# Patient Record
Sex: Female | Born: 1940
Health system: Southern US, Community
[De-identification: ages and names within clinical notes are randomized; demographics above are authoritative.]

## PROBLEM LIST (undated history)

## (undated) DIAGNOSIS — I10 Essential (primary) hypertension: Secondary | ICD-10-CM

## (undated) DIAGNOSIS — M109 Gout, unspecified: Secondary | ICD-10-CM

## (undated) DIAGNOSIS — M810 Age-related osteoporosis without current pathological fracture: Secondary | ICD-10-CM

## (undated) DIAGNOSIS — E785 Hyperlipidemia, unspecified: Secondary | ICD-10-CM

## (undated) HISTORY — DX: Gout, unspecified: M10.9

## (undated) HISTORY — PX: TONSILLECTOMY: SUR1361

## (undated) HISTORY — DX: Hyperlipidemia, unspecified: E78.5

## (undated) HISTORY — DX: Essential (primary) hypertension: I10

## (undated) HISTORY — DX: Age-related osteoporosis without current pathological fracture: M81.0

---

## 1998-03-13 ENCOUNTER — Other Ambulatory Visit: Admission: RE | Admit: 1998-03-13 | Discharge: 1998-03-13 | Payer: Self-pay | Admitting: Family Medicine

## 2001-08-30 ENCOUNTER — Other Ambulatory Visit: Admission: RE | Admit: 2001-08-30 | Discharge: 2001-08-30 | Payer: Self-pay | Admitting: Family Medicine

## 2002-11-28 ENCOUNTER — Other Ambulatory Visit: Admission: RE | Admit: 2002-11-28 | Discharge: 2002-11-28 | Payer: Self-pay | Admitting: Family Medicine

## 2005-05-19 ENCOUNTER — Other Ambulatory Visit: Admission: RE | Admit: 2005-05-19 | Discharge: 2005-05-19 | Payer: Self-pay | Admitting: Family Medicine

## 2010-04-29 ENCOUNTER — Encounter (INDEPENDENT_AMBULATORY_CARE_PROVIDER_SITE_OTHER): Payer: Self-pay | Admitting: *Deleted

## 2010-05-16 ENCOUNTER — Encounter (INDEPENDENT_AMBULATORY_CARE_PROVIDER_SITE_OTHER): Payer: Self-pay | Admitting: *Deleted

## 2010-05-19 ENCOUNTER — Ambulatory Visit
Admission: RE | Admit: 2010-05-19 | Discharge: 2010-05-19 | Payer: Self-pay | Source: Home / Self Care | Attending: Gastroenterology | Admitting: Gastroenterology

## 2010-05-22 NOTE — Letter (Signed)
Summary: Pre Visit Letter Revised  [facility]  [address] [place], [place] [address]   Phone: [phone]  Fax: [phone]        04/29/2010 MRN: [id number] Tamara Gross [address] [place], [place]  [address]             Procedure Date:  06-02-10   Welcome to the Gastroenterology Division at [facility].    You are scheduled to see a nurse for your pre-procedure visit on 05-19-10 at 2:00p.m. on the 3rd floor at [facility], [address]  We ask that you try to arrive at our office 15 minutes prior to your appointment time to allow for check-in.  Please take a minute to review the attached form.  If you answer "Yes" to one or more of the questions on the first page, we ask that you call the person listed at your earliest opportunity.  If you answer "No" to all of the questions, please complete the rest of the form and bring it to your appointment.    Your nurse visit will consist of discussing your medical and surgical history, your immediate family medical history, and your medications.   If you are unable to list all of your medications on the form, please bring the medication bottles to your appointment and we will list them.  We will need to be aware of both prescribed and over the counter drugs.  We will need to know exact dosage information as well.    Please be prepared to read and sign documents such as consent forms, a financial agreement, and acknowledgement forms.  If necessary, and with your consent, a friend or relative is welcome to sit-in on the nurse visit with you.  Please bring your insurance card so that we may make a copy of it.  If your insurance requires a referral to see a specialist, please bring your referral form from your primary care physician.  No co-pay is required for this nurse visit.     If you cannot keep your appointment, please call [phone] to cancel or reschedule prior to your appointment date.  This allows [place] the  opportunity to schedule an appointment for another patient in need of care.    Thank you for choosing [facility] for your medical needs.  We appreciate the opportunity to care for you.  Please visit [place] at our website  to learn more about our practice.  Sincerely, The Gastroenterology Division

## 2010-05-28 NOTE — Miscellaneous (Signed)
Summary: [facility]  Clinical Lists Changes  Medications: Added new medication of MOVIPREP 100 GM  SOLR (PEG-KCL-NACL-NASULF-NA ASC-C) As per prep instructions. - Signed Rx of MOVIPREP 100 GM  SOLR (PEG-KCL-NACL-NASULF-NA ASC-C) As per prep instructions.;  #1 x 0;  Signed;  Entered by: Ezra Sites RN;  Authorized by: Meryl Dare MD Clementeen Graham;  Method used: [facility] [id number]*, [address] [place], [place], [place]  [address], Ph: [phone] or [phone], Fax: [phone] Observations: Added new observation of NKA: T (05/19/2010 13:44)    Prescriptions: MOVIPREP 100 GM  SOLR (PEG-KCL-NACL-NASULF-NA ASC-C) As per prep instructions.  #1 x 0   Entered by:   Ezra Sites RN   Authorized by:   Meryl Dare MD [facility]   Signed by:   Ezra Sites RN on 05/19/2010   Method used:   Electronically to        [facility] [phone]* (retail)       [address]       [place], [place]  [address]       Ph: [phone] or [phone]       Fax: [phone]   RxID:   [id number]

## 2010-05-28 NOTE — Letter (Signed)
Summary: [facility] Instructions  [facility]  [address] [place], [place] [address]   Phone: [phone]  Fax: [phone]       RONIT CRANFIELD    1941/02/22    MRN: [id number]        Procedure Day /Date: Monday 06-02-10      Arrival Time: 10:30 am     Procedure Time: 11:30 am     Location of Procedure:                    _x _  [facility] (4th Floor)   PREPARATION FOR COLONOSCOPY WITH MOVIPREP   Starting 5 days prior to your procedure  05-28-10 do not eat nuts, seeds, popcorn, corn, beans, peas,  salads, or any raw vegetables.  Do not take any fiber supplements (e.g. Metamucil, Citrucel, and Benefiber).  THE DAY BEFORE YOUR PROCEDURE         DATE:  06-01-10   DAY: Sunday   1.  Drink clear liquids the entire day-NO SOLID FOOD  2.  Do not drink anything colored red or purple.  Avoid juices with pulp.  No orange juice.  3.  Drink at least 64 oz. (8 glasses) of fluid/clear liquids during the day to prevent dehydration and help the prep work efficiently.  CLEAR LIQUIDS INCLUDE: Water Jello Ice Popsicles Tea (sugar ok, no milk/cream) Powdered fruit flavored drinks Coffee (sugar ok, no milk/cream) Gatorade Juice: apple, white grape, white cranberry  Lemonade Clear bullion, consomm, broth Carbonated beverages (any kind) Strained chicken noodle soup Hard Candy                             4.  In the morning, mix first dose of MoviPrep solution:    Empty 1 Pouch A and 1 Pouch B into the disposable container    Add lukewarm drinking water to the top line of the container. Mix to dissolve    Refrigerate (mixed solution should be used within 24 hrs)  5.  Begin drinking the prep at 5:00 p.m. The MoviPrep container is divided by 4 marks.   Every 15 minutes drink the solution down to the next mark (approximately 8 oz) until the full liter is complete.   6.  Follow completed prep with 16 oz of clear liquid of your choice (Nothing red or purple).   Continue to drink clear liquids until bedtime.  7.  Before going to bed, mix second dose of MoviPrep solution:    Empty 1 Pouch A and 1 Pouch B into the disposable container    Add lukewarm drinking water to the top line of the container. Mix to dissolve    Refrigerate  THE DAY OF YOUR PROCEDURE      DATE:  06-02-10  DAY: Monday  Beginning at  6:30 a.m. (5 hours before procedure):         1. Every 15 minutes, drink the solution down to the next mark (approx 8 oz) until the full liter is complete.  2. Follow completed prep with 16 oz. of clear liquid of your choice.    3. You may drink clear liquids until  9:30 a.m. (2 HOURS BEFORE PROCEDURE).   MEDICATION INSTRUCTIONS  Unless otherwise instructed, you should take regular prescription medications with a small sip of water   as early as possible the morning of your procedure.  Additional medication instructions: Do not take Lisinopril/HCTZ day of  procedure.         OTHER INSTRUCTIONS  You will need a responsible adult at least 71 years of age to accompany you and drive you home.   This person must remain in the waiting room during your procedure.  Wear loose fitting clothing that is easily removed.  Leave jewelry and other valuables at home.  However, you may wish to bring a book to read or  an iPod/MP3 player to listen to music as you wait for your procedure to start.  Remove all body piercing jewelry and leave at home.  Total time from sign-in until discharge is approximately 2-3 hours.  You should go home directly after your procedure and rest.  You can resume normal activities the  day after your procedure.  The day of your procedure you should not:   Drive   Make legal decisions   Operate machinery   Drink alcohol   Return to work  You will receive specific instructions about eating, activities and medications before you leave.    The above instructions have been reviewed and explained to me by    Ezra Sites RN  May 19, 2010 2:18 PM     I fully understand and can verbalize these instructions _____________________________ Date _________

## 2010-06-02 ENCOUNTER — Encounter: Payer: Self-pay | Admitting: Gastroenterology

## 2010-06-02 ENCOUNTER — Other Ambulatory Visit (AMBULATORY_SURGERY_CENTER): Payer: Medicare Other | Admitting: Gastroenterology

## 2010-06-02 DIAGNOSIS — Z1211 Encounter for screening for malignant neoplasm of colon: Secondary | ICD-10-CM

## 2010-06-02 DIAGNOSIS — K5730 Diverticulosis of large intestine without perforation or abscess without bleeding: Secondary | ICD-10-CM

## 2010-06-11 NOTE — Procedures (Signed)
Summary: Colonoscopy  Patient: Tamara Gross Note: All result statuses are Final unless otherwise noted.  Tests: (1) Colonoscopy (COL)   COL Colonoscopy           DONE     [facility]     [address]     [place], [place]  [address]           COLONOSCOPY PROCEDURE REPORT     PATIENT:  [name], [name]  MR#:  [id number]     BIRTHDATE:  Alyviah 27, 1942, 69 yrs. old  GENDER:  female     ENDOSCOPIST:  Judie Petit T. Russella Dar, MD, [facility]     Referred by:  Rudi Heap, M.D.     PROCEDURE DATE:  06/02/2010     PROCEDURE:  Average-risk screening colonoscopy G0121     ASA CLASS:  Class II     INDICATIONS:  1) Routine Risk Screening     MEDICATIONS:   Fentanyl 50 mcg IV, Versed 5 mg IV     DESCRIPTION OF PROCEDURE:   After the risks benefits and     alternatives of the procedure were thoroughly explained, informed     consent was obtained.  Digital rectal exam was performed and     revealed no abnormalities.   The LB [id number]-[id number] endoscope     was introduced through the anus and advanced to the cecum, which     was identified by both the appendix and ileocecal valve, without     limitations.  The quality of the prep was good, using MoviPrep.     The instrument was then slowly withdrawn as the colon was fully     examined.     <<PROCEDUREIMAGES>>     FINDINGS:  Moderate diverticulosis was found in the sigmoid colon.     A normal appearing cecum, ileocecal valve, and appendiceal orifice     were identified. The ascending, hepatic flexure, transverse,     splenic flexure, descending colon, and rectum appeared     unremarkable. Retroflexed views in the rectum revealed no     abnormalities. The time to cecum =  3  minutes. The scope was then     withdrawn (time =  9  min) from the patient and the procedure     completed.           COMPLICATIONS:  None           ENDOSCOPIC IMPRESSION:     1) Moderate diverticulosis in the sigmoid colon           RECOMMENDATIONS:     1) High fiber diet with  liberal fluid intake.     2) Continue current colorectal screening for "routine risk"     patients with a repeat colonoscopy in 10 years.           Venita Lick. Russella Dar, MD, Clementeen Graham           n.     eSIGNED:   Venita Lick. Zuhair Lariccia at 06/02/2010 11:28 AM           Arvil Persons [id number]  Note: An exclamation mark (!) indicates a result that was not dispersed into the flowsheet. Document Creation Date: 06/02/2010 11:29 AM _______________________________________________________________________  (1) Order result status: Final Collection or observation date-time: 06/02/2010 11:25 Requested date-time:  Receipt date-time:  Reported date-time:  Referring Physician:   Ordering Physician: Claudette Head [phone]) Specimen Source:  Source: Launa Grill Order Number: [id number] Lab site:   Appended Document: Colonoscopy  Clinical Lists Changes  Observations: Added new observation of COLONNXTDUE: 05/2020 (06/02/2010 12:05)

## 2011-06-22 DIAGNOSIS — M109 Gout, unspecified: Secondary | ICD-10-CM | POA: Diagnosis not present

## 2011-06-22 DIAGNOSIS — N289 Disorder of kidney and ureter, unspecified: Secondary | ICD-10-CM | POA: Diagnosis not present

## 2011-06-22 DIAGNOSIS — N189 Chronic kidney disease, unspecified: Secondary | ICD-10-CM | POA: Diagnosis not present

## 2011-06-22 DIAGNOSIS — E559 Vitamin D deficiency, unspecified: Secondary | ICD-10-CM | POA: Diagnosis not present

## 2011-06-22 DIAGNOSIS — I10 Essential (primary) hypertension: Secondary | ICD-10-CM | POA: Diagnosis not present

## 2011-06-22 DIAGNOSIS — Z23 Encounter for immunization: Secondary | ICD-10-CM | POA: Diagnosis not present

## 2011-06-22 DIAGNOSIS — E785 Hyperlipidemia, unspecified: Secondary | ICD-10-CM | POA: Diagnosis not present

## 2011-09-28 DIAGNOSIS — L80 Vitiligo: Secondary | ICD-10-CM | POA: Diagnosis not present

## 2011-09-28 DIAGNOSIS — L299 Pruritus, unspecified: Secondary | ICD-10-CM | POA: Diagnosis not present

## 2011-09-28 DIAGNOSIS — L259 Unspecified contact dermatitis, unspecified cause: Secondary | ICD-10-CM | POA: Diagnosis not present

## 2011-10-08 DIAGNOSIS — L80 Vitiligo: Secondary | ICD-10-CM | POA: Diagnosis not present

## 2011-11-16 DIAGNOSIS — L80 Vitiligo: Secondary | ICD-10-CM | POA: Diagnosis not present

## 2012-01-27 DIAGNOSIS — Z23 Encounter for immunization: Secondary | ICD-10-CM | POA: Diagnosis not present

## 2012-02-15 DIAGNOSIS — L80 Vitiligo: Secondary | ICD-10-CM | POA: Diagnosis not present

## 2012-06-08 DIAGNOSIS — L80 Vitiligo: Secondary | ICD-10-CM | POA: Diagnosis not present

## 2012-06-10 DIAGNOSIS — L80 Vitiligo: Secondary | ICD-10-CM | POA: Diagnosis not present

## 2012-06-14 DIAGNOSIS — M109 Gout, unspecified: Secondary | ICD-10-CM | POA: Diagnosis not present

## 2012-06-14 DIAGNOSIS — E559 Vitamin D deficiency, unspecified: Secondary | ICD-10-CM | POA: Diagnosis not present

## 2012-06-14 DIAGNOSIS — E785 Hyperlipidemia, unspecified: Secondary | ICD-10-CM | POA: Diagnosis not present

## 2012-06-14 DIAGNOSIS — I10 Essential (primary) hypertension: Secondary | ICD-10-CM | POA: Diagnosis not present

## 2012-06-20 DIAGNOSIS — L80 Vitiligo: Secondary | ICD-10-CM | POA: Diagnosis not present

## 2012-07-11 DIAGNOSIS — L80 Vitiligo: Secondary | ICD-10-CM | POA: Diagnosis not present

## 2012-07-13 DIAGNOSIS — L80 Vitiligo: Secondary | ICD-10-CM | POA: Diagnosis not present

## 2012-07-18 DIAGNOSIS — L80 Vitiligo: Secondary | ICD-10-CM | POA: Diagnosis not present

## 2012-07-20 DIAGNOSIS — L80 Vitiligo: Secondary | ICD-10-CM | POA: Diagnosis not present

## 2012-07-25 DIAGNOSIS — L80 Vitiligo: Secondary | ICD-10-CM | POA: Diagnosis not present

## 2012-07-27 DIAGNOSIS — L80 Vitiligo: Secondary | ICD-10-CM | POA: Diagnosis not present

## 2012-08-03 DIAGNOSIS — L80 Vitiligo: Secondary | ICD-10-CM | POA: Diagnosis not present

## 2012-08-17 DIAGNOSIS — L80 Vitiligo: Secondary | ICD-10-CM | POA: Diagnosis not present

## 2012-08-22 DIAGNOSIS — L80 Vitiligo: Secondary | ICD-10-CM | POA: Diagnosis not present

## 2012-08-24 DIAGNOSIS — L80 Vitiligo: Secondary | ICD-10-CM | POA: Diagnosis not present

## 2012-08-29 DIAGNOSIS — L80 Vitiligo: Secondary | ICD-10-CM | POA: Diagnosis not present

## 2012-08-31 DIAGNOSIS — L80 Vitiligo: Secondary | ICD-10-CM | POA: Diagnosis not present

## 2012-09-07 DIAGNOSIS — L80 Vitiligo: Secondary | ICD-10-CM | POA: Diagnosis not present

## 2012-09-16 ENCOUNTER — Encounter: Payer: Self-pay | Admitting: Family Medicine

## 2012-09-16 ENCOUNTER — Ambulatory Visit (INDEPENDENT_AMBULATORY_CARE_PROVIDER_SITE_OTHER): Payer: Medicare Other | Admitting: Family Medicine

## 2012-09-16 VITALS — BP 122/80 | HR 80 | Temp 97.3°F | Ht 61.0 in | Wt 120.0 lb

## 2012-09-16 DIAGNOSIS — M810 Age-related osteoporosis without current pathological fracture: Secondary | ICD-10-CM

## 2012-09-16 DIAGNOSIS — M109 Gout, unspecified: Secondary | ICD-10-CM | POA: Diagnosis not present

## 2012-09-16 DIAGNOSIS — E559 Vitamin D deficiency, unspecified: Secondary | ICD-10-CM

## 2012-09-16 DIAGNOSIS — G510 Bell's palsy: Secondary | ICD-10-CM | POA: Insufficient documentation

## 2012-09-16 DIAGNOSIS — L80 Vitiligo: Secondary | ICD-10-CM | POA: Insufficient documentation

## 2012-09-16 DIAGNOSIS — N189 Chronic kidney disease, unspecified: Secondary | ICD-10-CM | POA: Diagnosis not present

## 2012-09-16 DIAGNOSIS — I10 Essential (primary) hypertension: Secondary | ICD-10-CM | POA: Insufficient documentation

## 2012-09-16 DIAGNOSIS — E785 Hyperlipidemia, unspecified: Secondary | ICD-10-CM | POA: Insufficient documentation

## 2012-09-16 DIAGNOSIS — Z780 Asymptomatic menopausal state: Secondary | ICD-10-CM | POA: Insufficient documentation

## 2012-09-16 LAB — COMPLETE METABOLIC PANEL WITH GFR
ALT: <8 U/L (ref 0–35)
AST: 12 U/L (ref 0–37)
Albumin: 4.3 g/dL (ref 3.5–5.2)
Alkaline Phosphatase: 54 U/L (ref 39–117)
BUN: 22 mg/dL (ref 6–23)
CO2: 23 mEq/L (ref 19–32)
Calcium: 9.2 mg/dL (ref 8.4–10.5)
Chloride: 109 mEq/L (ref 96–112)
Creat: 1.57 mg/dL — ABNORMAL HIGH (ref 0.50–1.10)
GFR, Est African American: 38 mL/min — ABNORMAL LOW
GFR, Est Non African American: 33 mL/min — ABNORMAL LOW
Glucose, Bld: 92 mg/dL (ref 70–99)
Potassium: 4.7 mEq/L (ref 3.5–5.3)
Sodium: 140 mEq/L (ref 135–145)
Total Bilirubin: 0.4 mg/dL (ref 0.3–1.2)
Total Protein: 7 g/dL (ref 6.0–8.3)

## 2012-09-16 LAB — URIC ACID: Uric Acid, Serum: 5.2 mg/dL (ref 2.4–6.0)

## 2012-09-16 MED ORDER — ALLOPURINOL 100 MG PO TABS: 100.0000 mg | ORAL_TABLET | Freq: Every day | ORAL | Status: DC

## 2012-09-16 MED ORDER — RALOXIFENE HCL 60 MG PO TABS: 60.0000 mg | ORAL_TABLET | Freq: Every day | ORAL | Status: DC

## 2012-09-16 MED ORDER — LISINOPRIL 20 MG PO TABS: 20.0000 mg | ORAL_TABLET | Freq: Every day | ORAL | Status: DC

## 2012-09-16 MED ORDER — NIFEDIPINE ER OSMOTIC RELEASE 60 MG PO TB24: 60.0000 mg | ORAL_TABLET | Freq: Every day | ORAL | Status: DC

## 2012-09-16 NOTE — Patient Instructions (Signed)
      Dr Olina Melfi's Recommendations  Diet and Exercise discussed with patient.  For nutrition information, I recommend books:  1).Eat to Live by Dr Joel Fuhrman. 2).Prevent and Reverse Heart Disease by Dr Caldwell Esselstyn. 3) Dr Neal Barnard's Book: Reversing Diabetes  Exercise recommendations are:  If unable to walk, then the patient can exercise in a chair 3 times a day. By flapping arms like a bird gently and raising legs outwards to the front.  If ambulatory, the patient can go for walks for 30 minutes 3 times a week. Then increase the intensity and duration as tolerated.  Goal is to try to attain exercise frequency to 5 times a week.  If applicable: Best to perform resistance exercises (machines or weights) 2 days a week and cardio type exercises 3 days per week.  

## 2012-09-16 NOTE — Progress Notes (Signed)
Patient ID: Tamara Gross, female   DOB: August 03, 1940, 72 y.o.   MRN: [id number] SUBJECTIVE: HPI: Patient is here for follow up of hypertension:  denies Headache;deniesChest Pain;denies weakness;denies Shortness of Breath or Orthopnea;denies Visual changes;denies palpitations;denies cough;denies pedal edema;denies symptoms of TIA or stroke; admits to Compliance with medications. denies Problems with medications. Doing great . No problems.  Having Photo therapy with Dr [place] for her Vitiligo  PMH/PSH: reviewed/updated in Epic  SH/FH: reviewed/updated in Epic  Allergies: reviewed/updated in Epic  Medications: reviewed/updated in Epic  Immunizations: reviewed/updated in Epic  ROS: As above in the HPI. All other systems are stable or negative.  OBJECTIVE: APPEARANCE:  Patient in no acute distress.The patient appeared well nourished and normally developed. Acyanotic. Waist: VITAL SIGNS:BP 122/80  Pulse 80  Temp(Src) 97.3 F (36.3 C) (Oral)  Ht 5\' 1"  (1.549 m)  Wt 120 lb (54.432 kg)  BMI 22.69 kg/m2 Elderly WF Looks well  SKIN: warm and  Dry without overt rashes, tattoos and scars. Marked Vitiligo especially of the neck and upper chest  HEAD and Neck: without JVD, Head and scalp: normal Eyes:No scleral icterus. Fundi normal, eye movements normal. Ears: Auricle normal, canal normal, Tympanic membranes normal, insufflation normal. Nose: normal Throat: normal Neck & thyroid: normal  CHEST & LUNGS: Chest wall: normal Lungs: Clear  CVS: Reveals the PMI to be normally located. Regular rhythm, First and Second Heart sounds are normal,  absence of murmurs, rubs or gallops. Peripheral vasculature: Radial pulses: normal Dorsal pedis pulses: normal Posterior pulses: normal  ABDOMEN:  Appearance: normal Benign,, no organomegaly, no masses, no Abdominal Aortic enlargement. No Guarding , no rebound. No Bruits. Bowel sounds: normal  RECTAL: N/A GU: N/A  EXTREMETIES:  nonedematous. Both Femoral and Pedal pulses are normal.  MUSCULOSKELETAL:  Spine: mild kyphosis Joints: intact  NEUROLOGIC: oriented to time,place and person; nonfocal. Strength is normal Sensory is normal Reflexes are normal Cranial Nervesfacila nerve palsy no changes. ASSESSMENT: HTN (hypertension) - Plan: NIFEdipine (PROCARDIA XL/ADALAT-CC) 60 MG 24 hr tablet, lisinopril (PRINIVIL,ZESTRIL) 20 MG tablet, COMPLETE METABOLIC PANEL WITH GFR  CKD (chronic kidney disease), unspecified stage - Plan: COMPLETE METABOLIC PANEL WITH GFR  Gout - Plan: allopurinol (ZYLOPRIM) 100 MG tablet, COMPLETE METABOLIC PANEL WITH GFR, Uric acid  HLD (hyperlipidemia) - Plan: NMR Lipoprofile with Lipids, COMPLETE METABOLIC PANEL WITH GFR  Vitiligo  Osteoporosis, unspecified  Facial nerve palsy  Postmenopausal - Plan: raloxifene (EVISTA) 60 MG tablet  Unspecified vitamin D deficiency - Plan: Vitamin D 25 hydroxy  PLAN: Orders Placed This Encounter  Procedures  . Vitamin D 25 hydroxy  . NMR Lipoprofile with Lipids  . COMPLETE METABOLIC PANEL WITH GFR  . Uric acid   No results found for this or any previous visit. Meds ordered this encounter  Medications  . DISCONTD: allopurinol (ZYLOPRIM) 100 MG tablet    Sig: Take 100 mg by mouth daily.   Marland Kitchen DISCONTD: lisinopril (PRINIVIL,ZESTRIL) 20 MG tablet    Sig: Take 20 mg by mouth daily.   Marland Kitchen DISCONTD: NIFEdipine (PROCARDIA XL/ADALAT-CC) 60 MG 24 hr tablet    Sig: Take 60 mg by mouth daily.   Marland Kitchen DISCONTD: EVISTA 60 MG tablet    Sig: Take 60 mg by mouth daily.   Marland Kitchen NIFEdipine (PROCARDIA XL/ADALAT-CC) 60 MG 24 hr tablet    Sig: Take 1 tablet (60 mg total) by mouth daily.    Dispense:  90 tablet    Refill:  3  . lisinopril (PRINIVIL,ZESTRIL) 20 MG tablet  Sig: Take 1 tablet (20 mg total) by mouth daily.    Dispense:  90 tablet    Refill:  3  . raloxifene (EVISTA) 60 MG tablet    Sig: Take 1 tablet (60 mg total) by mouth daily.    Dispense:  90  tablet    Refill:  3  . allopurinol (ZYLOPRIM) 100 MG tablet    Sig: Take 1 tablet (100 mg total) by mouth daily.    Dispense:  90 tablet    Refill:  3        Dr Woodroe Mode Recommendations  Diet and Exercise discussed with patient.  For nutrition information, I recommend books:  1).Eat to Live by Dr Monico Hoar. 2).Prevent and Reverse Heart Disease by Dr Suzzette Righter. 3) Dr Katherina Right Book: Reversing Diabetes  Exercise recommendations are:  If unable to walk, then the patient can exercise in a chair 3 times a day. By flapping arms like a bird gently and raising legs outwards to the front.  If ambulatory, the patient can go for walks for 30 minutes 3 times a week. Then increase the intensity and duration as tolerated.  Goal is to try to attain exercise frequency to 5 times a week.  If applicable: Best to perform resistance exercises (machines or weights) 2 days a week and cardio type exercises 3 days per week. Continue a healthy lifestyle.  Return in about 4 months (around 01/17/2013) for Recheck medical problems.  Erasmo Vertz P. Modesto Charon, M.D.

## 2012-09-17 LAB — VITAMIN D 25 HYDROXY (VIT D DEFICIENCY, FRACTURES): Vit D, 25-Hydroxy: 36 ng/mL (ref 30–89)

## 2012-09-19 DIAGNOSIS — L80 Vitiligo: Secondary | ICD-10-CM | POA: Diagnosis not present

## 2012-09-20 LAB — NMR LIPOPROFILE WITH LIPIDS
Cholesterol, Total: 155 mg/dL (ref <200)
HDL Particle Number: 27.7 umol/L — ABNORMAL LOW (ref >=30.5)
HDL Size: 9 nm — ABNORMAL LOW (ref >=9.2)
HDL-C: 43 mg/dL (ref >=40)
LDL (calc): 90 mg/dL (ref <100)
LDL Particle Number: 1149 nmol/L — ABNORMAL HIGH (ref <1000)
LDL Size: 20.8 nm (ref >20.5)
LP-IR Score: 37 (ref <=45)
Large HDL-P: 5.5 umol/L (ref >=4.8)
Large VLDL-P: <0.8 nmol/L (ref <=2.7)
Small LDL Particle Number: 455 nmol/L (ref <=527)
Triglycerides: 111 mg/dL (ref <150)
VLDL Size: 46.4 nm (ref <=46.6)

## 2012-09-20 NOTE — Progress Notes (Signed)
Quick Note:  Lab result at goal or close to goal. Aggressive dietary changes. exercise. No change in Medications for now. No Change in plans and follow up. ______

## 2012-09-21 ENCOUNTER — Telehealth: Payer: Self-pay | Admitting: Family Medicine

## 2012-09-21 NOTE — Telephone Encounter (Signed)
lmtcb- 6/4-jhb

## 2012-09-26 DIAGNOSIS — L80 Vitiligo: Secondary | ICD-10-CM | POA: Diagnosis not present

## 2012-09-28 DIAGNOSIS — L80 Vitiligo: Secondary | ICD-10-CM | POA: Diagnosis not present

## 2012-10-06 ENCOUNTER — Encounter: Payer: Self-pay | Admitting: *Deleted

## 2012-10-12 DIAGNOSIS — L80 Vitiligo: Secondary | ICD-10-CM | POA: Diagnosis not present

## 2012-11-02 DIAGNOSIS — L80 Vitiligo: Secondary | ICD-10-CM | POA: Diagnosis not present

## 2012-11-14 DIAGNOSIS — L80 Vitiligo: Secondary | ICD-10-CM | POA: Diagnosis not present

## 2012-11-17 DIAGNOSIS — L80 Vitiligo: Secondary | ICD-10-CM | POA: Diagnosis not present

## 2013-01-31 ENCOUNTER — Ambulatory Visit (INDEPENDENT_AMBULATORY_CARE_PROVIDER_SITE_OTHER): Payer: Medicare Other | Admitting: Family Medicine

## 2013-01-31 ENCOUNTER — Encounter: Payer: Self-pay | Admitting: Family Medicine

## 2013-01-31 VITALS — BP 114/70 | HR 87 | Temp 98.3°F | Ht 60.0 in | Wt 114.8 lb

## 2013-01-31 DIAGNOSIS — N189 Chronic kidney disease, unspecified: Secondary | ICD-10-CM | POA: Diagnosis not present

## 2013-01-31 DIAGNOSIS — L80 Vitiligo: Secondary | ICD-10-CM

## 2013-01-31 DIAGNOSIS — M109 Gout, unspecified: Secondary | ICD-10-CM | POA: Diagnosis not present

## 2013-01-31 DIAGNOSIS — E785 Hyperlipidemia, unspecified: Secondary | ICD-10-CM

## 2013-01-31 DIAGNOSIS — Z23 Encounter for immunization: Secondary | ICD-10-CM

## 2013-01-31 DIAGNOSIS — M810 Age-related osteoporosis without current pathological fracture: Secondary | ICD-10-CM | POA: Diagnosis not present

## 2013-01-31 DIAGNOSIS — G510 Bell's palsy: Secondary | ICD-10-CM

## 2013-01-31 DIAGNOSIS — I10 Essential (primary) hypertension: Secondary | ICD-10-CM | POA: Diagnosis not present

## 2013-01-31 DIAGNOSIS — Z780 Asymptomatic menopausal state: Secondary | ICD-10-CM

## 2013-01-31 NOTE — Progress Notes (Signed)
Patient ID: Tamara Gross, female   DOB: May 16, 1940, 72 y.o.   MRN: [id number] SUBJECTIVE: CC: Chief Complaint  Patient presents with  . Follow-up    4 month follow up   . Medication Refill    needs refills     HPI: Has been Okay. Nothing new, no problems. Patient is here for follow up of hyperlipidemia/HTN/osteoporosis: denies Headache;denies Chest Pain;denies weakness;denies Shortness of Breath and orthopnea;denies Visual changes;denies palpitations;denies cough;denies pedal edema;denies symptoms of TIA or stroke;deniesClaudication symptoms. admits to Compliance with medications; denies Problems with medications.  Past Medical History  Diagnosis Date  . Gout   . Hyperlipidemia   . Osteoporosis    Past Surgical History  Procedure Laterality Date  . Tonsillectomy     History   Social History  . Marital Status: Widowed    Spouse Name: N/A    Number of Children: N/A  . Years of Education: N/A   Occupational History  . Not on file.   Social History Main Topics  . Smoking status: Never Smoker   . Smokeless tobacco: Not on file  . Alcohol Use: Not on file  . Drug Use: Not on file  . Sexual Activity: Not on file   Other Topics Concern  . Not on file   Social History Narrative  . No narrative on file   Family History  Problem Relation Age of Onset  . Deep vein thrombosis Father    Current Outpatient Prescriptions on File Prior to Visit  Medication Sig Dispense Refill  . allopurinol (ZYLOPRIM) 100 MG tablet Take 1 tablet (100 mg total) by mouth daily.  90 tablet  3  . lisinopril (PRINIVIL,ZESTRIL) 20 MG tablet Take 1 tablet (20 mg total) by mouth daily.  90 tablet  3  . NIFEdipine (PROCARDIA XL/ADALAT-CC) 60 MG 24 hr tablet Take 1 tablet (60 mg total) by mouth daily.  90 tablet  3  . raloxifene (EVISTA) 60 MG tablet Take 1 tablet (60 mg total) by mouth daily.  90 tablet  3   No current facility-administered medications on file prior to visit.   No Known  Allergies Immunization History  Administered Date(s) Administered  . Influenza,inj,Quad PF,36+ Mos 01/31/2013  . Pneumococcal Polysaccharide 07/29/2011   Prior to Admission medications   Medication Sig Start Date End Date Taking? Authorizing Provider  allopurinol (ZYLOPRIM) 100 MG tablet Take 1 tablet (100 mg total) by mouth daily. 09/16/12  Yes Ileana Ladd, MD  Cholecalciferol (VITAMIN D) 2000 UNITS CAPS Take 1 capsule by mouth.   Yes Historical Provider, MD  lisinopril (PRINIVIL,ZESTRIL) 20 MG tablet Take 1 tablet (20 mg total) by mouth daily. 09/16/12  Yes Ileana Ladd, MD  Multiple Vitamin (MULTIVITAMIN) tablet Take 1 tablet by mouth daily.   Yes Historical Provider, MD  NIFEdipine (PROCARDIA XL/ADALAT-CC) 60 MG 24 hr tablet Take 1 tablet (60 mg total) by mouth daily. 09/16/12  Yes Ileana Ladd, MD  raloxifene (EVISTA) 60 MG tablet Take 1 tablet (60 mg total) by mouth daily. 09/16/12  Yes Ileana Ladd, MD     ROS: As above in the HPI. All other systems are stable or negative.  OBJECTIVE: APPEARANCE:  Patient in no acute distress.The patient appeared well nourished and normally developed. Acyanotic. Waist: VITAL SIGNS:BP 114/70  Pulse 87  Temp(Src) 98.3 F (36.8 C) (Oral)  Ht 5' (1.524 m)  Wt 114 lb 12.8 oz (52.073 kg)  BMI 22.42 kg/m2 WF  SKIN: warm and  Dry without overt  rashes, tattoos and scars.vitiligo+++  HEAD and Neck: without JVD, Head and scalp: left bell's palsy residual Eyes:No scleral icterus. Fundi normal, eye movements normal. Ears: Auricle normal, canal normal, Tympanic membranes normal, insufflation normal. Nose: normal Throat: normal Neck & thyroid: normal  CHEST & LUNGS: Chest wall: normal Lungs: Clear  CVS: Reveals the PMI to be normally located. Regular rhythm, First and Second Heart sounds are normal,  absence of murmurs, rubs or gallops. Peripheral vasculature: Radial pulses: normal Dorsal pedis pulses: normal Posterior pulses:  normal  ABDOMEN:  Appearance: normal Benign, no organomegaly, no masses, no Abdominal Aortic enlargement. No Guarding , no rebound. No Bruits. Bowel sounds: normal  RECTAL: N/A GU: N/A  EXTREMETIES: nonedematous.  MUSCULOSKELETAL:  Spine: mild kyphosis Joints: intact  NEUROLOGIC: oriented to time,place and person; nonfocal. Strength is normal Sensory is normal Reflexes are normal Cranial Nerves are normal.  Results for orders placed in visit on 09/16/12  VITAMIN D 25 HYDROXY      Result Value Range   Vit D, 25-Hydroxy 36  30 - 89 ng/mL  NMR LIPOPROFILE WITH LIPIDS      Result Value Range   LDL Particle Number 1149 (*) <1000 nmol/L   LDL (calc) 90  <100 mg/dL   HDL-C 43  >=16 mg/dL   Triglycerides 109  <604 mg/dL   Cholesterol, Total 540  <200 mg/dL   HDL Particle Number 98.1 (*) >=30.5 umol/L   Large HDL-P 5.5  >=4.8 umol/L   Large VLDL-P <0.8  <=2.7 nmol/L   Small LDL Particle Number 455  <=527 nmol/L   LDL Size 20.8  >20.5 nm   HDL Size 9.0 (*) >=9.2 nm   VLDL Size 46.4  <=46.6 nm   LP-IR Score 37  <=45  COMPLETE METABOLIC PANEL WITH GFR      Result Value Range   Sodium 140  135 - 145 mEq/L   Potassium 4.7  3.5 - 5.3 mEq/L   Chloride 109  96 - 112 mEq/L   CO2 23  19 - 32 mEq/L   Glucose, Bld 92  70 - 99 mg/dL   BUN 22  6 - 23 mg/dL   Creat 1.91 (*) 4.78 - 1.10 mg/dL   Total Bilirubin 0.4  0.3 - 1.2 mg/dL   Alkaline Phosphatase 54  39 - 117 U/L   AST 12  0 - 37 U/L   ALT <8  0 - 35 U/L   Total Protein 7.0  6.0 - 8.3 g/dL   Albumin 4.3  3.5 - 5.2 g/dL   Calcium 9.2  8.4 - 29.5 mg/dL   GFR, Est African American 38 (*)    GFR, Est Non African American 33 (*)   URIC ACID      Result Value Range   Uric Acid, Serum 5.2  2.4 - 6.0 mg/dL    ASSESSMENT: Need for prophylactic vaccination and inoculation against influenza  CKD (chronic kidney disease) - Plan: CMP14+EGFR  Gout - Plan: CMP14+EGFR  HLD (hyperlipidemia) - Plan: CMP14+EGFR, Lipid panel  HTN  (hypertension)  Vitiligo  Osteoporosis, unspecified - Plan: Vit D  25 hydroxy (rtn osteoporosis monitoring)  Postmenopausal  Facial nerve palsy  Patient is  stanble and doing well. Health maintenance is UTD except for zostavax and TDAP. She received her flushot today. She plans to check with her insurance the coverage.   PLAN: Orders Placed This Encounter  Procedures  . CMP14+EGFR  . Lipid panel  . Vit D  25 hydroxy (rtn  osteoporosis monitoring)   Protect the left eye with artificial tears and taping down lids at night. Same medications to be continued. They were reviewed. Keep active. Healthy diet.  Return in about 5 months (around 07/01/2013) for Recheck medical problems. Follow up in 5 months instead of 4 months due to peak flu activity months is in 3 to 5 months.  Glendon Fiser P. Modesto Charon, M.D.

## 2013-02-01 LAB — CMP14+EGFR
ALT: 8 IU/L (ref 0–32)
AST: 11 IU/L (ref 0–40)
Albumin/Globulin Ratio: 1.6 (ref 1.1–2.5)
Albumin: 4.2 g/dL (ref 3.5–4.8)
Alkaline Phosphatase: 61 IU/L (ref 39–117)
BUN/Creatinine Ratio: 12 (ref 11–26)
BUN: 21 mg/dL (ref 8–27)
CO2: 21 mmol/L (ref 18–29)
Calcium: 9.6 mg/dL (ref 8.6–10.2)
Chloride: 103 mmol/L (ref 97–108)
Creatinine, Ser: 1.69 mg/dL — ABNORMAL HIGH (ref 0.57–1.00)
GFR calc Af Amer: 34 mL/min/{1.73_m2} — ABNORMAL LOW (ref >59)
GFR calc non Af Amer: 30 mL/min/{1.73_m2} — ABNORMAL LOW (ref >59)
Globulin, Total: 2.6 g/dL (ref 1.5–4.5)
Glucose: 92 mg/dL (ref 65–99)
Potassium: 4.8 mmol/L (ref 3.5–5.2)
Sodium: 140 mmol/L (ref 134–144)
Total Bilirubin: 0.3 mg/dL (ref 0.0–1.2)
Total Protein: 6.8 g/dL (ref 6.0–8.5)

## 2013-02-01 LAB — LIPID PANEL
Chol/HDL Ratio: 3.2 ratio units (ref 0.0–4.4)
Cholesterol, Total: 163 mg/dL (ref 100–199)
HDL: 51 mg/dL (ref >39)
LDL Calculated: 89 mg/dL (ref 0–99)
Triglycerides: 116 mg/dL (ref 0–149)
VLDL Cholesterol Cal: 23 mg/dL (ref 5–40)

## 2013-02-01 LAB — VITAMIN D 25 HYDROXY (VIT D DEFICIENCY, FRACTURES): Vit D, 25-Hydroxy: 34.9 ng/mL (ref 30.0–100.0)

## 2013-05-22 DIAGNOSIS — L819 Disorder of pigmentation, unspecified: Secondary | ICD-10-CM | POA: Diagnosis not present

## 2013-05-22 DIAGNOSIS — L80 Vitiligo: Secondary | ICD-10-CM | POA: Diagnosis not present

## 2013-07-04 ENCOUNTER — Encounter: Payer: Self-pay | Admitting: Family Medicine

## 2013-07-04 ENCOUNTER — Ambulatory Visit (INDEPENDENT_AMBULATORY_CARE_PROVIDER_SITE_OTHER): Payer: Medicare Other | Admitting: Family Medicine

## 2013-07-04 VITALS — BP 119/74 | HR 86 | Temp 97.1°F | Ht 60.0 in | Wt 117.4 lb

## 2013-07-04 DIAGNOSIS — Z23 Encounter for immunization: Secondary | ICD-10-CM

## 2013-07-04 DIAGNOSIS — M109 Gout, unspecified: Secondary | ICD-10-CM

## 2013-07-04 DIAGNOSIS — Z780 Asymptomatic menopausal state: Secondary | ICD-10-CM

## 2013-07-04 DIAGNOSIS — E785 Hyperlipidemia, unspecified: Secondary | ICD-10-CM

## 2013-07-04 DIAGNOSIS — M810 Age-related osteoporosis without current pathological fracture: Secondary | ICD-10-CM

## 2013-07-04 DIAGNOSIS — N189 Chronic kidney disease, unspecified: Secondary | ICD-10-CM | POA: Diagnosis not present

## 2013-07-04 DIAGNOSIS — I10 Essential (primary) hypertension: Secondary | ICD-10-CM

## 2013-07-04 DIAGNOSIS — L80 Vitiligo: Secondary | ICD-10-CM

## 2013-07-04 DIAGNOSIS — G510 Bell's palsy: Secondary | ICD-10-CM

## 2013-07-04 NOTE — Progress Notes (Signed)
Patient ID: Tamara Gross, female   DOB: January 09, 1941, 73 y.o.   MRN: [id number] SUBJECTIVE: CC: Chief Complaint  Patient presents with  . Follow-up    5 month folllow up  chronic problems     HPI: Patient is here for follow up of hyperlipidemia/gout/osteoporosis: denies Headache;denies Chest Pain;denies weakness;denies Shortness of Breath and orthopnea;denies Visual changes;denies palpitations;denies cough;denies pedal edema;denies symptoms of TIA or stroke;deniesClaudication symptoms. admits to Compliance with medications; denies Problems with medications.   Past Medical History  Diagnosis Date  . Gout   . Hyperlipidemia   . Osteoporosis    Past Surgical History  Procedure Laterality Date  . Tonsillectomy     History   Social History  . Marital Status: Widowed    Spouse Name: N/A    Number of Children: N/A  . Years of Education: N/A   Occupational History  . Not on file.   Social History Main Topics  . Smoking status: Never Smoker   . Smokeless tobacco: Not on file  . Alcohol Use: Not on file  . Drug Use: Not on file  . Sexual Activity: Not on file   Other Topics Concern  . Not on file   Social History Narrative  . No narrative on file   Family History  Problem Relation Age of Onset  . Deep vein thrombosis Father    Current Outpatient Prescriptions on File Prior to Visit  Medication Sig Dispense Refill  . allopurinol (ZYLOPRIM) 100 MG tablet Take 1 tablet (100 mg total) by mouth daily.  90 tablet  3  . Cholecalciferol (VITAMIN D) 2000 UNITS CAPS Take 1 capsule by mouth.      Marland Kitchen lisinopril (PRINIVIL,ZESTRIL) 20 MG tablet Take 1 tablet (20 mg total) by mouth daily.  90 tablet  3  . Multiple Vitamin (MULTIVITAMIN) tablet Take 1 tablet by mouth daily.      Marland Kitchen NIFEdipine (PROCARDIA XL/ADALAT-CC) 60 MG 24 hr tablet Take 1 tablet (60 mg total) by mouth daily.  90 tablet  3  . raloxifene (EVISTA) 60 MG tablet Take 1 tablet (60 mg total) by mouth daily.  90 tablet  3    No current facility-administered medications on file prior to visit.   No Known Allergies Immunization History  Administered Date(s) Administered  . Influenza,inj,Quad PF,36+ Mos 01/31/2013  . Pneumococcal Polysaccharide-23 07/29/2011  . Zoster 07/04/2013   Prior to Admission medications   Medication Sig Start Date End Date Taking? Authorizing Provider  allopurinol (ZYLOPRIM) 100 MG tablet Take 1 tablet (100 mg total) by mouth daily. 09/16/12   Vernie Shanks, MD  Cholecalciferol (VITAMIN D) 2000 UNITS CAPS Take 1 capsule by mouth.    Historical Provider, MD  lisinopril (PRINIVIL,ZESTRIL) 20 MG tablet Take 1 tablet (20 mg total) by mouth daily. 09/16/12   Vernie Shanks, MD  Multiple Vitamin (MULTIVITAMIN) tablet Take 1 tablet by mouth daily.    Historical Provider, MD  NIFEdipine (PROCARDIA XL/ADALAT-CC) 60 MG 24 hr tablet Take 1 tablet (60 mg total) by mouth daily. 09/16/12   Vernie Shanks, MD  raloxifene (EVISTA) 60 MG tablet Take 1 tablet (60 mg total) by mouth daily. 09/16/12   Vernie Shanks, MD     ROS: As above in the HPI. All other systems are stable or negative.  OBJECTIVE: APPEARANCE:  Patient in no acute distress.The patient appeared well nourished and normally developed. Acyanotic. Waist: VITAL SIGNS:BP 119/74  Pulse 86  Temp(Src) 97.1 F (36.2 C) (Oral)  Ht  5' (1.524 m)  Wt 117 lb 6.4 oz (53.252 kg)  BMI 22.93 kg/m2  WF looks well. SKIN: warm and  Dry without overt rashes, tattoos and scars  HEAD and Neck: without JVD, Head and scalp: normal Eyes:No scleral icterus. Fundi normal, eye movements normal.no change in residual left facial palsy. Ears: Auricle normal, canal normal, Tympanic membranes normal, insufflation normal. Nose: normal Throat: normal Neck & thyroid: normal  CHEST & LUNGS: Chest wall: normal Lungs: Clear  CVS: Reveals the PMI to be normally located. Regular rhythm, First and Second Heart sounds are normal,  absence of murmurs, rubs  or gallops. Peripheral vasculature: Radial pulses: normal Dorsal pedis pulses: normal Posterior pulses: normal  ABDOMEN:  Appearance: normal Benign, no organomegaly, no masses, no Abdominal Aortic enlargement. No Guarding , no rebound. No Bruits. Bowel sounds: normal  RECTAL: N/A GU: N/A  EXTREMETIES: nonedematous.  MUSCULOSKELETAL:  Spine: normal Joints: intact  NEUROLOGIC: oriented to time,place and person; nonfocal. Strength is normal Sensory is normal Reflexes are normal Cranial Nerves: residual left facial palsy. Unchanged.   Results for orders placed in visit on 01/31/13  CMP14+EGFR      Result Value Ref Range   Glucose 92  65 - 99 mg/dL   BUN 21  8 - 27 mg/dL   Creatinine, Ser 1.69 (*) 0.57 - 1.00 mg/dL   GFR calc non Af Amer 30 (*) >59 mL/min/1.73   GFR calc Af Amer 34 (*) >59 mL/min/1.73   BUN/Creatinine Ratio 12  11 - 26   Sodium 140  134 - 144 mmol/L   Potassium 4.8  3.5 - 5.2 mmol/L   Chloride 103  97 - 108 mmol/L   CO2 21  18 - 29 mmol/L   Calcium 9.6  8.6 - 10.2 mg/dL   Total Protein 6.8  6.0 - 8.5 g/dL   Albumin 4.2  3.5 - 4.8 g/dL   Globulin, Total 2.6  1.5 - 4.5 g/dL   Albumin/Globulin Ratio 1.6  1.1 - 2.5   Total Bilirubin 0.3  0.0 - 1.2 mg/dL   Alkaline Phosphatase 61  39 - 117 IU/L   AST 11  0 - 40 IU/L   ALT 8  0 - 32 IU/L  LIPID PANEL      Result Value Ref Range   Cholesterol, Total 163  100 - 199 mg/dL   Triglycerides 116  0 - 149 mg/dL   HDL 51  >39 mg/dL   VLDL Cholesterol Cal 23  5 - 40 mg/dL   LDL Calculated 89  0 - 99 mg/dL   Chol/HDL Ratio 3.2  0.0 - 4.4 ratio units  VITAMIN D 25 HYDROXY      Result Value Ref Range   Vit D, 25-Hydroxy 34.9  30.0 - 100.0 ng/mL    ASSESSMENT:  Osteoporosis, unspecified - Plan: Vit D  25 hydroxy (rtn osteoporosis monitoring)  HTN (hypertension) - Plan: CMP14+EGFR  HLD (hyperlipidemia) - Plan: CMP14+EGFR, Lipid panel  CKD (chronic kidney disease) - Plan:  CMP14+EGFR  Postmenopausal  Gout - Plan: Uric acid  Facial nerve palsy  Vitiligo  Need for zoster vaccination - Plan: Varicella-zoster vaccine subcutaneous Doing well  PLAN: Handout on zoster shot.  Orders Placed This Encounter  Procedures  . Varicella-zoster vaccine subcutaneous  . CMP14+EGFR  . Lipid panel  . Uric acid  . Vit D  25 hydroxy (rtn osteoporosis monitoring)  same medications.   Meds ordered this encounter  Medications  . tacrolimus (PROTOPIC) 0.1 % ointment  Sig:   healthy lifestyle.  There are no discontinued medications. Return in about 4 months (around 11/03/2013) for Recheck medical problems.  Durwin Davisson P. Jacelyn Grip, M.D.

## 2013-07-04 NOTE — Progress Notes (Signed)
Tolerated zostervax without difficulty 

## 2013-07-04 NOTE — Patient Instructions (Signed)
Shingles Vaccine  What You Need to Know  WHAT IS SHINGLES?  · Shingles is a painful skin rash, often with blisters. It is also called Herpes Zoster or just Zoster.  · A shingles rash usually appears on one side of the face or body and lasts from 2 to 4 weeks. Its main symptom is pain, which can be quite severe. Other symptoms of shingles can include fever, headache, chills, and upset stomach. Very rarely, a shingles infection can lead to pneumonia, hearing problems, blindness, brain inflammation (encephalitis), or death.  · For about 1 person in 5, severe pain can continue even after the rash clears up. This is called post-herpetic neuralgia.  · Shingles is caused by the Varicella Zoster virus. This is the same virus that causes chickenpox. Only someone who has had a case of chickenpox or rarely, has gotten chickenpox vaccine, can get shingles. The virus stays in your body. It can reappear many years later to cause a case of shingles.  · You cannot catch shingles from another person with shingles. However, a person who has never had chickenpox (or chickenpox vaccine) could get chickenpox from someone with shingles. This is not very common.  · Shingles is far more common in people 50 and older than in younger people. It is also more common in people whose immune systems are weakened because of a disease such as cancer or drugs such as steroids or chemotherapy.  · At least 1 million people get shingles per year in the [place].  SHINGLES VACCINE  · A vaccine for shingles was licensed in 2006. In clinical trials, the vaccine reduced the risk of shingles by 50%. It can also reduce the pain in people who still get shingles after being vaccinated.  · A single dose of shingles vaccine is recommended for adults 60 years of age and older.  SOME PEOPLE SHOULD NOT GET SHINGLES VACCINE OR SHOULD WAIT  A person should not get shingles vaccine if he or she:  · Has ever had a life-threatening allergic reaction to gelatin, the  antibiotic neomycin, or any other component of shingles vaccine. Tell your caregiver if you have any severe allergies.  · Has a weakened immune system because of current:  · AIDS or another disease that affects the immune system.  · Treatment with drugs that affect the immune system, such as prolonged use of high-dose steroids.  · Cancer treatment, such as radiation or chemotherapy.  · Cancer affecting the bone marrow or lymphatic system, such as leukemia or lymphoma.  · Is pregnant, or might be pregnant. Women should not become pregnant until at least 4 weeks after getting shingles vaccine.  Someone with a minor illness, such as a cold, may be vaccinated. Anyone with a moderate or severe acute illness should usually wait until he or she recovers before getting the vaccine. This includes anyone with a temperature of 101.3° F (38° C) or higher.  WHAT ARE THE RISKS FROM SHINGLES VACCINE?  · A vaccine, like any medicine, could possibly cause serious problems, such as severe allergic reactions. However, the risk of a vaccine causing serious harm, or death, is extremely small.  · No serious problems have been identified with shingles vaccine.  Mild Problems  · Redness, soreness, swelling, or itching at the site of the injection (about 1 person in 3).  · Headache (about 1 person in 70).  Like all vaccines, shingles vaccine is being closely monitored for unusual or severe problems.  WHAT IF   THERE IS A MODERATE OR SEVERE REACTION?  What should I look for?  Any unusual condition, such as a severe allergic reaction or a high fever. If a severe allergic reaction occurred, it would be within a few minutes to an hour after the shot. Signs of a serious allergic reaction can include difficulty breathing, weakness, hoarseness or wheezing, a fast heartbeat, hives, dizziness, paleness, or swelling of the throat.  What should I do?  · Call your caregiver, or get the person to a caregiver right away.  · Tell the caregiver what  happened, the date and time it happened, and when the vaccination was given.  · Ask the caregiver to report the reaction by filing a Vaccine Adverse Event Reporting System (VAERS) form. Or, you can file this report through the VAERS web site at www.vaers.hhs.gov or by calling 1-800-822-7967.  VAERS does not provide medical advice.  HOW CAN I LEARN MORE?  · Ask your caregiver. He or she can give you the vaccine package insert or suggest other sources of information.  · Contact the Centers for Disease Control and Prevention (CDC):  · Call 1-800-232-4636 (1-800-CDC-INFO).  · Visit the CDC website at www.cdc.gov/vaccines  CDC Shingles Vaccine VIS (01/24/08)  Document Released: 02/01/2006 Document Revised: 06/29/2011 Document Reviewed: 07/27/2012  [employer]® Patient Information ©2014 [employer].

## 2013-07-05 ENCOUNTER — Other Ambulatory Visit: Payer: Self-pay | Admitting: Family Medicine

## 2013-07-05 ENCOUNTER — Telehealth: Payer: Self-pay | Admitting: Family Medicine

## 2013-07-05 ENCOUNTER — Encounter: Payer: Self-pay | Admitting: Family Medicine

## 2013-07-05 DIAGNOSIS — E559 Vitamin D deficiency, unspecified: Secondary | ICD-10-CM | POA: Insufficient documentation

## 2013-07-05 LAB — CMP14+EGFR
ALT: 8 IU/L (ref 0–32)
AST: 14 IU/L (ref 0–40)
Albumin/Globulin Ratio: 1.5 (ref 1.1–2.5)
Albumin: 4.2 g/dL (ref 3.5–4.8)
Alkaline Phosphatase: 64 IU/L (ref 39–117)
BUN/Creatinine Ratio: 14 (ref 11–26)
BUN: 24 mg/dL (ref 8–27)
CO2: 23 mmol/L (ref 18–29)
Calcium: 9.6 mg/dL (ref 8.7–10.3)
Chloride: 106 mmol/L (ref 97–108)
Creatinine, Ser: 1.66 mg/dL — ABNORMAL HIGH (ref 0.57–1.00)
GFR calc Af Amer: 35 mL/min/{1.73_m2} — ABNORMAL LOW (ref >59)
GFR calc non Af Amer: 30 mL/min/{1.73_m2} — ABNORMAL LOW (ref >59)
Globulin, Total: 2.8 g/dL (ref 1.5–4.5)
Glucose: 97 mg/dL (ref 65–99)
Potassium: 4.7 mmol/L (ref 3.5–5.2)
Sodium: 143 mmol/L (ref 134–144)
Total Bilirubin: 0.3 mg/dL (ref 0.0–1.2)
Total Protein: 7 g/dL (ref 6.0–8.5)

## 2013-07-05 LAB — LIPID PANEL
Chol/HDL Ratio: 4 ratio units (ref 0.0–4.4)
Cholesterol, Total: 178 mg/dL (ref 100–199)
HDL: 45 mg/dL (ref >39)
LDL Calculated: 101 mg/dL — ABNORMAL HIGH (ref 0–99)
Triglycerides: 161 mg/dL — ABNORMAL HIGH (ref 0–149)
VLDL Cholesterol Cal: 32 mg/dL (ref 5–40)

## 2013-07-05 LAB — VITAMIN D 25 HYDROXY (VIT D DEFICIENCY, FRACTURES): Vit D, 25-Hydroxy: 27.8 ng/mL — ABNORMAL LOW (ref 30.0–100.0)

## 2013-07-05 LAB — URIC ACID: Uric Acid: 5.6 mg/dL (ref 2.5–7.1)

## 2013-07-05 MED ORDER — PRAVASTATIN SODIUM 10 MG PO TABS: 10.0000 mg | ORAL_TABLET | Freq: Every day | ORAL | Status: DC

## 2013-07-05 NOTE — Telephone Encounter (Signed)
Message copied by Azalee CourseFULP, ASHLEY on Wed Jul 05, 2013  3:59 PM ------      Message from: Ileana LaddWONG, FRANCIS P      Created: Wed Jul 05, 2013 12:03 PM       Labs which are not at goal:      Cholesterol is not at goal.      LDLc should be< 70      Vitamin d is a little low.            The rest are at goal or stable. The kidney function is stable.            Will need to make adjustments:            Recommend the following:       Increase the vitamin d to 4,000 Units  Daily      I would like to add a statin low dose to attain the goal. Will order in EPIC. PRavastatin 10 mg daily                          ------

## 2013-08-22 ENCOUNTER — Other Ambulatory Visit: Payer: Self-pay | Admitting: *Deleted

## 2013-08-22 DIAGNOSIS — M109 Gout, unspecified: Secondary | ICD-10-CM

## 2013-08-22 DIAGNOSIS — I10 Essential (primary) hypertension: Secondary | ICD-10-CM

## 2013-08-22 MED ORDER — NIFEDIPINE ER OSMOTIC RELEASE 60 MG PO TB24: 60.0000 mg | ORAL_TABLET | Freq: Every day | ORAL | Status: DC

## 2013-08-22 MED ORDER — ALLOPURINOL 100 MG PO TABS: 100.0000 mg | ORAL_TABLET | Freq: Every day | ORAL | Status: DC

## 2013-08-22 MED ORDER — LISINOPRIL 20 MG PO TABS: 20.0000 mg | ORAL_TABLET | Freq: Every day | ORAL | Status: DC

## 2014-01-29 ENCOUNTER — Ambulatory Visit (INDEPENDENT_AMBULATORY_CARE_PROVIDER_SITE_OTHER): Payer: Medicare Other

## 2014-01-29 DIAGNOSIS — Z23 Encounter for immunization: Secondary | ICD-10-CM

## 2014-02-27 ENCOUNTER — Other Ambulatory Visit: Payer: Self-pay | Admitting: *Deleted

## 2014-02-27 MED ORDER — NIFEDIPINE ER OSMOTIC RELEASE 60 MG PO TB24: 60.0000 mg | ORAL_TABLET | Freq: Every day | ORAL | Status: DC

## 2014-02-27 NOTE — Telephone Encounter (Signed)
Please schedule this patient an appointment to be seen by provider--- the prescription can be refilled 1

## 2014-02-27 NOTE — Telephone Encounter (Signed)
Last ov 3/15. ntbs

## 2014-03-09 ENCOUNTER — Encounter: Payer: Self-pay | Admitting: Family Medicine

## 2014-03-09 ENCOUNTER — Ambulatory Visit (INDEPENDENT_AMBULATORY_CARE_PROVIDER_SITE_OTHER): Payer: Medicare Other

## 2014-03-09 ENCOUNTER — Ambulatory Visit (INDEPENDENT_AMBULATORY_CARE_PROVIDER_SITE_OTHER): Payer: Medicare Other | Admitting: Family Medicine

## 2014-03-09 VITALS — BP 138/79 | HR 105 | Temp 97.9°F | Ht 61.24 in | Wt 118.0 lb

## 2014-03-09 DIAGNOSIS — S0993XA Unspecified injury of face, initial encounter: Secondary | ICD-10-CM | POA: Diagnosis not present

## 2014-03-09 DIAGNOSIS — W19XXXA Unspecified fall, initial encounter: Secondary | ICD-10-CM | POA: Diagnosis not present

## 2014-03-09 MED ORDER — NAPROXEN 500 MG PO TABS: 500.0000 mg | ORAL_TABLET | Freq: Two times a day (BID) | ORAL | Status: DC

## 2014-03-09 NOTE — Progress Notes (Signed)
   Subjective:    Patient ID: Tamara Gross, female    DOB: February 01, 1941, 73 y.o.   MRN: [id number]  HPI Patient is here for follow up on fall.  She fell on her face.  She is c/o right facial discomfort.  She has been putting an ice pack on her face.  Review of Systems  Constitutional: Negative for fever.  HENT: Negative for ear pain.   Eyes: Negative for discharge.  Respiratory: Negative for cough.   Cardiovascular: Negative for chest pain.  Gastrointestinal: Negative for abdominal distention.  Endocrine: Negative for polyuria.  Genitourinary: Negative for difficulty urinating.  Musculoskeletal: Negative for gait problem and neck pain.  Skin: Negative for color change and rash.  Neurological: Negative for speech difficulty and headaches.  Psychiatric/Behavioral: Negative for agitation.       Objective:    BP 138/79 mmHg  Pulse 105  Temp(Src) 97.9 F (36.6 C) (Oral)  Ht 5' 1.24" (1.555 m)  Wt 118 lb (53.524 kg)  BMI 22.14 kg/m2 Physical Exam  Constitutional: She is oriented to person, place, and time. She appears well-developed and well-nourished.  HENT:  Head: Normocephalic and atraumatic.  Mouth/Throat: Oropharynx is clear and moist.  Eyes: Pupils are equal, round, and reactive to light.  Neck: Normal range of motion. Neck supple.  Cardiovascular: Normal rate and regular rhythm.   No murmur heard. Pulmonary/Chest: Effort normal and breath sounds normal.  Abdominal: Soft. Bowel sounds are normal. There is no tenderness.  Neurological: She is alert and oriented to person, place, and time.  Skin: Skin is warm and dry.  Psychiatric: She has a normal mood and affect.    Facial Xrays - No fracture Prelimnary reading by Angeline SlimWilliam Sophira Rumler,FNP      Assessment & Plan:     ICD-9-CM ICD-10-CM   1. Fall, initial encounter [id number] W19.XXXA DG Facial Bones Complete   Naprosyn 500mg  po bid prn facial pain and recommend using ice pack to right face.  No Follow-up on  file.  Deatra CanterWilliam J Merrily Tegeler FNP

## 2014-04-16 ENCOUNTER — Other Ambulatory Visit: Payer: Self-pay | Admitting: *Deleted

## 2014-04-16 ENCOUNTER — Other Ambulatory Visit: Payer: Self-pay | Admitting: Family Medicine

## 2014-04-16 MED ORDER — LISINOPRIL 20 MG PO TABS: 20.0000 mg | ORAL_TABLET | Freq: Every day | ORAL | Status: DC

## 2014-04-18 ENCOUNTER — Other Ambulatory Visit: Payer: Self-pay | Admitting: Family Medicine

## 2014-05-07 ENCOUNTER — Ambulatory Visit (INDEPENDENT_AMBULATORY_CARE_PROVIDER_SITE_OTHER): Payer: Medicare Other | Admitting: Family Medicine

## 2014-05-07 ENCOUNTER — Encounter: Payer: Self-pay | Admitting: Family Medicine

## 2014-05-07 VITALS — BP 120/75 | HR 99 | Temp 97.6°F | Ht 61.25 in | Wt 115.6 lb

## 2014-05-07 DIAGNOSIS — E785 Hyperlipidemia, unspecified: Secondary | ICD-10-CM

## 2014-05-07 DIAGNOSIS — E559 Vitamin D deficiency, unspecified: Secondary | ICD-10-CM | POA: Diagnosis not present

## 2014-05-07 DIAGNOSIS — Z780 Asymptomatic menopausal state: Secondary | ICD-10-CM

## 2014-05-07 DIAGNOSIS — I10 Essential (primary) hypertension: Secondary | ICD-10-CM

## 2014-05-07 DIAGNOSIS — M1000 Idiopathic gout, unspecified site: Secondary | ICD-10-CM | POA: Diagnosis not present

## 2014-05-07 DIAGNOSIS — Z23 Encounter for immunization: Secondary | ICD-10-CM

## 2014-05-07 DIAGNOSIS — M109 Gout, unspecified: Secondary | ICD-10-CM | POA: Diagnosis not present

## 2014-05-07 MED ORDER — RALOXIFENE HCL 60 MG PO TABS
60.0000 mg | ORAL_TABLET | Freq: Every day | ORAL | Status: DC
Start: 2014-05-07 — End: 2014-11-05

## 2014-05-07 MED ORDER — ALLOPURINOL 100 MG PO TABS: 100.0000 mg | ORAL_TABLET | Freq: Every day | ORAL | Status: DC

## 2014-05-07 MED ORDER — LISINOPRIL 20 MG PO TABS: 20.0000 mg | ORAL_TABLET | Freq: Every day | ORAL | Status: DC

## 2014-05-07 NOTE — Patient Instructions (Signed)
Walk in soon for Prevnar vaccine. Continue meds as is. Resume Evista and Vitamin

## 2014-05-07 NOTE — Progress Notes (Signed)
   Subjective:    Patient ID: Tamara Gross Surgeon, female    DOB: 26-May-1940, 74 y.o.   MRN: [id number]  HPI  Pt is here today for follow up of chronic medical problems which include hypertension, hyperlipidemia, Vit D deficiency and osteoporosis.  Currently not on statin. Taking OTC Vit D as well as BP meds below. Also denies recent gout attack. No joint pains. No fractures. Pt. Desires to control coronary risk factors.. Follows low fat & low salt diet.       Review of Systems  Constitutional: Negative for fever, chills, diaphoresis, appetite change, fatigue and unexpected weight change.  HENT: Negative for congestion, ear pain, hearing loss, postnasal drip, rhinorrhea, sneezing, sore throat and trouble swallowing.   Eyes: Negative for pain.  Respiratory: Negative for cough, chest tightness and shortness of breath.   Cardiovascular: Negative for chest pain and palpitations.  Gastrointestinal: Negative for nausea, vomiting, abdominal pain, diarrhea and constipation.  Genitourinary: Negative for dysuria, frequency and menstrual problem.  Musculoskeletal: Negative for joint swelling and arthralgias.  Skin: Negative for rash.  Neurological: Negative for dizziness, weakness, numbness and headaches.  Psychiatric/Behavioral: Negative for dysphoric mood and agitation.      Patient Active Problem List   Diagnosis Date Noted  . Unspecified vitamin D deficiency 07/05/2013  . Postmenopausal 09/16/2012  . Facial nerve palsy 09/16/2012  . Osteoporosis, unspecified 09/16/2012  . HLD (hyperlipidemia) 09/16/2012  . CKD (chronic kidney disease) 09/16/2012  . Gout 09/16/2012  . Vitiligo 09/16/2012  . HTN (hypertension) 09/16/2012   Outpatient Encounter Prescriptions as of 05/07/2014  Medication Sig  . allopurinol (ZYLOPRIM) 100 MG tablet Take 1 tablet (100 mg total) by mouth daily.  . Cholecalciferol (VITAMIN D) 2000 UNITS CAPS Take 1 capsule by mouth.  Marland Kitchen lisinopril (PRINIVIL,ZESTRIL) 20 MG  tablet Take 1 tablet (20 mg total) by mouth daily.  . Multiple Vitamin (MULTIVITAMIN) tablet Take 1 tablet by mouth daily.  Marland Kitchen NIFEdipine (PROCARDIA XL/ADALAT-CC) 60 MG 24 hr tablet Take 1 tablet (60 mg total) by mouth daily.  . raloxifene (EVISTA) 60 MG tablet Take 1 tablet (60 mg total) by mouth daily.  . tacrolimus (PROTOPIC) 0.1 % ointment   . [DISCONTINUED] naproxen (NAPROSYN) 500 MG tablet Take 1 tablet (500 mg total) by mouth 2 (two) times daily with a meal. (Patient not taking: Reported on 05/07/2014)      Objective:   Physical Exam BP 120/75 mmHg  Pulse 99  Temp(Src) 97.6 F (36.4 C) (Oral)  Ht 5' 1.25" (1.556 m)  Wt 115 lb 9.6 oz (52.436 kg)  BMI 21.66 kg/m2        Assessment & Plan:  1. Essential hypertension - CMP14+EGFR - Lipid panel - Uric acid - Vit D  25 hydroxy (rtn osteoporosis monitoring)  2. HLD (hyperlipidemia) - CMP14+EGFR - Lipid panel - Uric acid - Vit D  25 hydroxy (rtn osteoporosis monitoring)  3. Idiopathic gout, unspecified chronicity, unspecified  Currently  stable - CMP14+EGFR - Lipid panel - Uric acid - Vit D  25 hydroxy (rtn osteoporosis monitoring)  4. Vitamin D deficiency - CMP14+EGFR - Lipid panel - Uric acid - Vit D  25 hydroxy (rtn osteoporosis monitoring)  5. Postmenopausal - raloxifene (EVISTA) 60 MG tablet; Take 1 tablet (60 mg total) by mouth daily.  Dispense: 90 tablet; Refill: 3

## 2014-05-08 LAB — CMP14+EGFR
A/G RATIO: 1.8 (ref 1.1–2.5)
ALK PHOS: 64 IU/L (ref 39–117)
ALT: 6 IU/L (ref 0–32)
AST: 15 IU/L (ref 0–40)
Albumin: 4.4 g/dL (ref 3.5–4.8)
BUN/Creatinine Ratio: 12 (ref 11–26)
BUN: 19 mg/dL (ref 8–27)
CO2: 23 mmol/L (ref 18–29)
CREATININE: 1.58 mg/dL — AB (ref 0.57–1.00)
Calcium: 9.8 mg/dL (ref 8.7–10.3)
Chloride: 103 mmol/L (ref 97–108)
GFR calc Af Amer: 37 mL/min/{1.73_m2} — ABNORMAL LOW (ref >59)
GFR calc non Af Amer: 32 mL/min/{1.73_m2} — ABNORMAL LOW (ref >59)
GLOBULIN, TOTAL: 2.5 g/dL (ref 1.5–4.5)
GLUCOSE: 102 mg/dL — AB (ref 65–99)
Potassium: 4.3 mmol/L (ref 3.5–5.2)
Sodium: 141 mmol/L (ref 134–144)
Total Bilirubin: 0.2 mg/dL (ref 0.0–1.2)
Total Protein: 6.9 g/dL (ref 6.0–8.5)

## 2014-05-08 LAB — LIPID PANEL
CHOL/HDL RATIO: 3.6 ratio (ref 0.0–4.4)
CHOLESTEROL TOTAL: 182 mg/dL (ref 100–199)
HDL: 50 mg/dL (ref >39)
LDL Calculated: 104 mg/dL — ABNORMAL HIGH (ref 0–99)
Triglycerides: 140 mg/dL (ref 0–149)
VLDL Cholesterol Cal: 28 mg/dL (ref 5–40)

## 2014-05-08 LAB — URIC ACID: URIC ACID: 6.1 mg/dL (ref 2.5–7.1)

## 2014-05-08 LAB — VITAMIN D 25 HYDROXY (VIT D DEFICIENCY, FRACTURES): VIT D 25 HYDROXY: 51 ng/mL (ref 30.0–100.0)

## 2014-05-17 ENCOUNTER — Other Ambulatory Visit: Payer: Self-pay | Admitting: Family Medicine

## 2014-06-18 ENCOUNTER — Encounter: Payer: Self-pay | Admitting: *Deleted

## 2014-10-15 ENCOUNTER — Other Ambulatory Visit: Payer: Self-pay

## 2014-11-05 ENCOUNTER — Ambulatory Visit (INDEPENDENT_AMBULATORY_CARE_PROVIDER_SITE_OTHER): Payer: Medicare Other | Admitting: Family Medicine

## 2014-11-05 ENCOUNTER — Encounter: Payer: Self-pay | Admitting: Family Medicine

## 2014-11-05 VITALS — BP 129/81 | HR 92 | Temp 99.1°F | Ht 61.25 in | Wt 115.2 lb

## 2014-11-05 DIAGNOSIS — Z1212 Encounter for screening for malignant neoplasm of rectum: Secondary | ICD-10-CM

## 2014-11-05 DIAGNOSIS — N183 Chronic kidney disease, stage 3 (moderate): Secondary | ICD-10-CM

## 2014-11-05 DIAGNOSIS — Z23 Encounter for immunization: Secondary | ICD-10-CM | POA: Diagnosis not present

## 2014-11-05 DIAGNOSIS — M1000 Idiopathic gout, unspecified site: Secondary | ICD-10-CM | POA: Diagnosis not present

## 2014-11-05 DIAGNOSIS — G510 Bell's palsy: Secondary | ICD-10-CM | POA: Diagnosis not present

## 2014-11-05 DIAGNOSIS — M810 Age-related osteoporosis without current pathological fracture: Secondary | ICD-10-CM

## 2014-11-05 DIAGNOSIS — Z780 Asymptomatic menopausal state: Secondary | ICD-10-CM | POA: Diagnosis not present

## 2014-11-05 DIAGNOSIS — I10 Essential (primary) hypertension: Secondary | ICD-10-CM

## 2014-11-05 DIAGNOSIS — R739 Hyperglycemia, unspecified: Secondary | ICD-10-CM | POA: Diagnosis not present

## 2014-11-05 DIAGNOSIS — E785 Hyperlipidemia, unspecified: Secondary | ICD-10-CM | POA: Diagnosis not present

## 2014-11-05 LAB — POCT CBG (FASTING - GLUCOSE)-MANUAL ENTRY: Glucose Fasting, POC: 96 mg/dL (ref 70–99)

## 2014-11-05 MED ORDER — ALLOPURINOL 100 MG PO TABS: 100.0000 mg | ORAL_TABLET | Freq: Every day | ORAL | Status: DC

## 2014-11-05 MED ORDER — NIFEDIPINE ER OSMOTIC RELEASE 60 MG PO TB24: 60.0000 mg | ORAL_TABLET | Freq: Every day | ORAL | Status: DC

## 2014-11-05 MED ORDER — LISINOPRIL 20 MG PO TABS: 20.0000 mg | ORAL_TABLET | Freq: Every day | ORAL | Status: DC

## 2014-11-05 MED ORDER — RALOXIFENE HCL 60 MG PO TABS: 60.0000 mg | ORAL_TABLET | Freq: Every day | ORAL | Status: DC

## 2014-11-05 NOTE — Addendum Note (Signed)
Addended by: Almeta MonasSTONE, Bevely Hackbart M on: 11/05/2014 11:25 AM   Modules accepted: Orders

## 2014-11-05 NOTE — Progress Notes (Signed)
Subjective:  Patient ID: Tamara Gross, female    DOB: 03-Jul-1940  Age: 74 y.o. MRN: [id number]  CC: Hypertension; Gout; and Medication Refill   HPI Tamara Gross presents for  follow-up of hypertension. Patient has no history of headache chest pain or shortness of breath or recent cough. Patient also denies symptoms of TIA such as numbness weakness lateralizing. Patient checks  blood pressure at home and has not had any elevated readings recently. Patient denies side effects from his medication. States taking it regularly.   Patient in for follow-up of elevated cholesterol. Currently on diet control only. Currently no chest pain, shortness of breath or other cardiovascular related symptoms noted.  Cardiac risk factors noted to include advanced age hypertension elevated cholesterol, postmenopausal. She is not a diabetic. No history of previous vascular/cardiovascular condition.  Patient denies any recent bouts with her gout. She continues to take her allopurinol without side effects. She denies any joint pains. With the exception that sometimes her right leg is painful. She has been told that her osteoporosis flaring. She was noted to have decreased bone density on a recent DEXA. Since that time she's been taking Evista. She denies any side effects from that medicine. She says she is taking it daily. She denies any family history of breast cancer. However she does have friends and does want the breast cancer protection of this offers as well as osteoporosis protection. Additionally she does take the vitamin D noted below and a calcium supplement.  Patient states that her vision is somewhat clouded on the left side at times. It is relieved by the use of lubricant eyedrops. She does keep in touch with her eye doctor with regard to periodic exams. Her Bell's palsy was 20 years ago. She does have some weakness. The eye does not close properly.  History Tamara Gross has a past medical history of Gout;  Hyperlipidemia; Osteoporosis; and Hypertension.   She has past surgical history that includes Tonsillectomy. Denies Hyst. Colonoscopy reviewed from 05/2010 No polyps. Pos sigmoid diverticulosis  Her family history includes Deep vein thrombosis in her father.She reports that she has never smoked. She does not have any smokeless tobacco history on file. Her alcohol and drug histories are not on file.  Outpatient Prescriptions Prior to Visit  Medication Sig Dispense Refill  . allopurinol (ZYLOPRIM) 100 MG tablet Take 1 tablet (100 mg total) by mouth daily. 90 tablet 1  . Cholecalciferol (VITAMIN D) 2000 UNITS CAPS Take 1 capsule by mouth.    Marland Kitchen lisinopril (PRINIVIL,ZESTRIL) 20 MG tablet Take 1 tablet (20 mg total) by mouth daily. 90 tablet 3  . Multiple Vitamin (MULTIVITAMIN) tablet Take 1 tablet by mouth daily.    Marland Kitchen NIFEdipine (PROCARDIA XL/ADALAT-CC) 60 MG 24 hr tablet TAKE ONE TABLET BY MOUTH ONCE DAILY 30 tablet 5  . raloxifene (EVISTA) 60 MG tablet Take 1 tablet (60 mg total) by mouth daily. 90 tablet 3  . tacrolimus (PROTOPIC) 0.1 % ointment      No facility-administered medications prior to visit.    ROS Review of Systems  Constitutional: Negative for fever, chills, diaphoresis, appetite change, fatigue and unexpected weight change.  HENT: Negative for congestion, ear pain, hearing loss, postnasal drip, rhinorrhea, sneezing, sore throat and trouble swallowing.   Eyes: Negative for pain.  Respiratory: Negative for cough, chest tightness and shortness of breath.   Cardiovascular: Negative for chest pain and palpitations.  Gastrointestinal: Negative for nausea, vomiting, abdominal pain, diarrhea and constipation.  Genitourinary: Negative  for dysuria, frequency and menstrual problem.  Musculoskeletal: Negative for joint swelling and arthralgias.  Skin: Negative for rash.  Neurological: Negative for dizziness, weakness, numbness and headaches.  Psychiatric/Behavioral: Negative for  dysphoric mood and agitation.    Objective:  BP 129/81 mmHg  Pulse 92  Temp(Src) 99.1 F (37.3 C) (Oral)  Ht 5' 1.25" (1.556 m)  Wt 115 lb 3.2 oz (52.254 kg)  BMI 21.58 kg/m2  BP Readings from Last 3 Encounters:  11/05/14 129/81  05/07/14 120/75  03/09/14 138/79    Wt Readings from Last 3 Encounters:  11/05/14 115 lb 3.2 oz (52.254 kg)  05/07/14 115 lb 9.6 oz (52.436 kg)  03/09/14 118 lb (53.524 kg)     Physical Exam  Constitutional: She is oriented to person, place, and time. She appears well-developed and well-nourished. No distress.  HENT:  Head: Normocephalic and atraumatic.  Right Ear: External ear normal.  Left Ear: External ear normal.  Nose: Nose normal.  Mouth/Throat: Oropharynx is clear and moist.  Eyes: Conjunctivae and EOM are normal. Pupils are equal, round, and reactive to light.  Neck: Normal range of motion. Neck supple. No thyromegaly present.  Cardiovascular: Normal rate, regular rhythm and normal heart sounds.   No murmur heard. Pulmonary/Chest: Effort normal and breath sounds normal. No respiratory distress. She has no wheezes. She has no rales. She exhibits no tenderness.  Abdominal: Soft. Bowel sounds are normal. She exhibits no distension and no mass. There is no tenderness. There is no rebound and no guarding.  Genitourinary:  Rectal free of mass IFOB obtained.  Lymphadenopathy:    She has no cervical adenopathy.  Neurological: She is alert and oriented to person, place, and time. She has normal reflexes.  Skin: Skin is warm and dry.  Psychiatric: She has a normal mood and affect. Her behavior is normal. Judgment and thought content normal.    No results found for: HGBA1C  Lab Results  Component Value Date   GLUCOSE 102* 05/07/2014   CHOL 182 05/07/2014   TRIG 140 05/07/2014   HDL 50 05/07/2014   LDLCALC 104* 05/07/2014   ALT 6 05/07/2014   AST 15 05/07/2014   NA 141 05/07/2014   K 4.3 05/07/2014   CL 103 05/07/2014   CREATININE  1.58* 05/07/2014   BUN 19 05/07/2014   CO2 23 05/07/2014    No results found.  Assessment & Plan:   Tamara Gross was seen today for hypertension, gout and medication refill.  Diagnoses and all orders for this visit:  HLD (hyperlipidemia) Orders: -     POCT CBG (Fasting - Glucose) -     Comprehensive metabolic panel -     Lipid panel -     Urinalysis, Routine w reflex microscopic -     EKG 12-Lead -     Vit D  25 hydroxy (rtn osteoporosis monitoring) -     Fecal occult blood, imunochemical -     TSH  Facial nerve palsy Orders: -     POCT CBG (Fasting - Glucose) -     Comprehensive metabolic panel -     Lipid panel -     Urinalysis, Routine w reflex microscopic -     Vit D  25 hydroxy (rtn osteoporosis monitoring) -     Fecal occult blood, imunochemical  Osteoporosis Orders: -     POCT CBG (Fasting - Glucose) -     Comprehensive metabolic panel -     Lipid panel -  Urinalysis, Routine w reflex microscopic -     Vit D  25 hydroxy (rtn osteoporosis monitoring) -     Fecal occult blood, imunochemical -     TSH  CKD (chronic kidney disease), stage 3 (moderate) Orders: -     POCT CBG (Fasting - Glucose) -     Comprehensive metabolic panel -     Lipid panel -     Urinalysis, Routine w reflex microscopic -     EKG 12-Lead -     Vit D  25 hydroxy (rtn osteoporosis monitoring) -     Fecal occult blood, imunochemical -     Uric acid -     TSH  Idiopathic gout, unspecified chronicity, unspecified site Orders: -     POCT CBG (Fasting - Glucose) -     Comprehensive metabolic panel -     Lipid panel -     Urinalysis, Routine w reflex microscopic -     Vit D  25 hydroxy (rtn osteoporosis monitoring) -     Fecal occult blood, imunochemical -     Uric acid -     TSH  Essential hypertension Orders: -     Comprehensive metabolic panel -     Lipid panel -     Urinalysis, Routine w reflex microscopic -     EKG 12-Lead -     Fecal occult blood, imunochemical -      TSH   I am having Ms. Daphine DeutscherMartin maintain her Vitamin D, multivitamin, tacrolimus, allopurinol, lisinopril, raloxifene, and NIFEdipine.  She was given guidance regarding breast cancer prevention and self-exam. We discussed colonoscopy due in 5-1/2 years. We did the rectal exam as an interim. She is using a teaspoon daily of Metamucil for the diverticulosis found on her most recent colonoscopy. She was advised to use a high-fiber diet as well. She is also trying to avoid nuts and seeds.  Guidance regarding a low fat diet also given area and she is really [place] [facility] rec [facility]. We discussed using the elliptical machine and to control impact on the joints.  She was reminded about auto accidents and wearing the seatbelt driving carefully particularly if her vision is blurred at the time.  She was reminded to avoid alcohol and tobacco products.   Follow-up: Return in 6 months (on 05/08/2015) for hypertension, cholesterol.  Mechele ClaudeWarren Onelia Cadmus, M.D.

## 2014-11-06 LAB — LIPID PANEL
CHOLESTEROL TOTAL: 155 mg/dL (ref 100–199)
Chol/HDL Ratio: 3.1 ratio units (ref 0.0–4.4)
HDL: 50 mg/dL (ref >39)
LDL Calculated: 81 mg/dL (ref 0–99)
Triglycerides: 119 mg/dL (ref 0–149)
VLDL CHOLESTEROL CAL: 24 mg/dL (ref 5–40)

## 2014-11-06 LAB — COMPREHENSIVE METABOLIC PANEL
ALBUMIN: 4.1 g/dL (ref 3.5–4.8)
ALT: 8 IU/L (ref 0–32)
AST: 14 IU/L (ref 0–40)
Albumin/Globulin Ratio: 1.5 (ref 1.1–2.5)
Alkaline Phosphatase: 46 IU/L (ref 39–117)
BUN/Creatinine Ratio: 14 (ref 11–26)
BUN: 24 mg/dL (ref 8–27)
Bilirubin Total: 0.3 mg/dL (ref 0.0–1.2)
CALCIUM: 9.1 mg/dL (ref 8.7–10.3)
CO2: 20 mmol/L (ref 18–29)
CREATININE: 1.71 mg/dL — AB (ref 0.57–1.00)
Chloride: 101 mmol/L (ref 97–108)
GFR calc Af Amer: 34 mL/min/{1.73_m2} — ABNORMAL LOW (ref >59)
GFR calc non Af Amer: 29 mL/min/{1.73_m2} — ABNORMAL LOW (ref >59)
GLOBULIN, TOTAL: 2.8 g/dL (ref 1.5–4.5)
Glucose: 98 mg/dL (ref 65–99)
Potassium: 4.3 mmol/L (ref 3.5–5.2)
Sodium: 139 mmol/L (ref 134–144)
TOTAL PROTEIN: 6.9 g/dL (ref 6.0–8.5)

## 2014-11-06 LAB — URINALYSIS, ROUTINE W REFLEX MICROSCOPIC
Bilirubin, UA: NEGATIVE
GLUCOSE, UA: NEGATIVE
Ketones, UA: NEGATIVE
LEUKOCYTES UA: NEGATIVE
Nitrite, UA: NEGATIVE
PH UA: 5.5 (ref 5.0–7.5)
PROTEIN UA: NEGATIVE
RBC UA: NEGATIVE
Specific Gravity, UA: 1.015 (ref 1.005–1.030)
UUROB: 0.2 mg/dL (ref 0.2–1.0)

## 2014-11-06 LAB — URIC ACID: URIC ACID: 5.5 mg/dL (ref 2.5–7.1)

## 2014-11-06 LAB — VITAMIN D 25 HYDROXY (VIT D DEFICIENCY, FRACTURES): Vit D, 25-Hydroxy: 46.4 ng/mL (ref 30.0–100.0)

## 2014-11-06 LAB — TSH: TSH: 4.01 u[IU]/mL (ref 0.450–4.500)

## 2014-11-07 LAB — FECAL OCCULT BLOOD, IMMUNOCHEMICAL: FECAL OCCULT BLD: NEGATIVE

## 2015-01-30 ENCOUNTER — Ambulatory Visit (INDEPENDENT_AMBULATORY_CARE_PROVIDER_SITE_OTHER): Payer: Medicare Other

## 2015-01-30 DIAGNOSIS — Z23 Encounter for immunization: Secondary | ICD-10-CM | POA: Diagnosis not present

## 2015-05-08 ENCOUNTER — Encounter: Payer: Self-pay | Admitting: Family Medicine

## 2015-05-08 ENCOUNTER — Ambulatory Visit (INDEPENDENT_AMBULATORY_CARE_PROVIDER_SITE_OTHER): Payer: Medicare Other | Admitting: Family Medicine

## 2015-05-08 VITALS — BP 142/80 | HR 88 | Temp 98.8°F | Ht 61.25 in | Wt 116.6 lb

## 2015-05-08 DIAGNOSIS — N183 Chronic kidney disease, stage 3 (moderate): Secondary | ICD-10-CM | POA: Diagnosis not present

## 2015-05-08 DIAGNOSIS — R059 Cough, unspecified: Secondary | ICD-10-CM

## 2015-05-08 DIAGNOSIS — Z780 Asymptomatic menopausal state: Secondary | ICD-10-CM

## 2015-05-08 DIAGNOSIS — I10 Essential (primary) hypertension: Secondary | ICD-10-CM

## 2015-05-08 DIAGNOSIS — E785 Hyperlipidemia, unspecified: Secondary | ICD-10-CM | POA: Diagnosis not present

## 2015-05-08 DIAGNOSIS — R05 Cough: Secondary | ICD-10-CM | POA: Diagnosis not present

## 2015-05-08 DIAGNOSIS — D649 Anemia, unspecified: Secondary | ICD-10-CM | POA: Diagnosis not present

## 2015-05-08 MED ORDER — BETAMETHASONE SOD PHOS & ACET 6 (3-3) MG/ML IJ SUSP
6.0000 mg | Freq: Once | INTRAMUSCULAR | Status: AC
  Administered 2015-05-08: 6 mg via INTRAMUSCULAR

## 2015-05-08 MED ORDER — NIFEDIPINE ER OSMOTIC RELEASE 60 MG PO TB24: 60.0000 mg | ORAL_TABLET | Freq: Every day | ORAL | Status: DC

## 2015-05-08 MED ORDER — ALLOPURINOL 100 MG PO TABS: 100.0000 mg | ORAL_TABLET | Freq: Every day | ORAL | Status: DC

## 2015-05-08 MED ORDER — BENZONATATE 200 MG PO CAPS: 200.0000 mg | ORAL_CAPSULE | Freq: Three times a day (TID) | ORAL | Status: DC | PRN

## 2015-05-08 NOTE — Progress Notes (Signed)
Subjective:  Patient ID: Tamara Gross, female    DOB: 1940/09/16  Age: 75 y.o. MRN: [id number]  CC: Hypertension; Hyperlipidemia; and Osteoporosis   HPI Tamara Gross presents for  [facility] cough for 1 week. Patient presents with upper respiratory congestion. Rhinorrhea that is Clear. There is moderate sore throat. Patient reports cough, without sputum noted. There is no fever no chills no sweats. The patient denies being short of breath. Onset was 3-5 days ago. Gradually worsening in spite of home remedies.  follow-up of hypertension. Patient has no history of headache chest pain or shortness of breath or recent cough. Patient also denies symptoms of TIA such as numbness weakness lateralizing. Patient checks  blood pressure at home and has not had any elevated readings recently. Patient denies side effects from his medication. States taking it regularly. Patient in for follow-up of elevated cholesterol. Doing well without complaints on current medication. Denies side effects of statin including myalgia and arthralgia and nausea. Also in today for liver function testing. Currently no chest pain, shortness of breath or other cardiovascular related symptoms noted.  History Tamara Gross has a past medical history of Gout; Hyperlipidemia; Osteoporosis; and Hypertension.   She has past surgical history that includes Tonsillectomy.   Her family history includes Deep vein thrombosis in her father.She reports that she has never smoked. She does not have any smokeless tobacco history on file. Her alcohol and drug histories are not on file.  Outpatient Prescriptions Prior to Visit  Medication Sig Dispense Refill  . Cholecalciferol (VITAMIN D) 2000 UNITS CAPS Take 1 capsule by mouth.    Marland Kitchen lisinopril (PRINIVIL,ZESTRIL) 20 MG tablet Take 1 tablet (20 mg total) by mouth daily. 90 tablet 3  . Multiple Vitamin (MULTIVITAMIN) tablet Take 1 tablet by mouth daily.    . raloxifene (EVISTA) 60 MG tablet Take 1 tablet  (60 mg total) by mouth daily. 90 tablet 3  . tacrolimus (PROTOPIC) 0.1 % ointment     . allopurinol (ZYLOPRIM) 100 MG tablet Take 1 tablet (100 mg total) by mouth daily. 90 tablet 1  . NIFEdipine (PROCARDIA XL/ADALAT-CC) 60 MG 24 hr tablet Take 1 tablet (60 mg total) by mouth daily. 30 tablet 5   No facility-administered medications prior to visit.    ROS Review of Systems  Constitutional: Negative for fever, chills, activity change and appetite change.  HENT: Positive for congestion and postnasal drip. Negative for ear discharge, ear pain, hearing loss, nosebleeds, rhinorrhea, sinus pressure, sneezing and trouble swallowing.   Eyes: Negative for visual disturbance.  Respiratory: Positive for cough. Negative for chest tightness and shortness of breath.   Cardiovascular: Negative for chest pain and palpitations.  Gastrointestinal: Negative for nausea, abdominal pain and diarrhea.  Genitourinary: Negative for dysuria.  Musculoskeletal: Negative for myalgias and arthralgias.  Skin: Negative for rash.    Objective:  BP 142/80 mmHg  Pulse 88  Temp(Src) 98.8 F (37.1 C) (Oral)  Ht 5' 1.25" (1.556 m)  Wt 116 lb 9.6 oz (52.889 kg)  BMI 21.84 kg/m2  SpO2 97%  BP Readings from Last 3 Encounters:  05/08/15 142/80  11/05/14 129/81  05/07/14 120/75    Wt Readings from Last 3 Encounters:  05/08/15 116 lb 9.6 oz (52.889 kg)  11/05/14 115 lb 3.2 oz (52.254 kg)  05/07/14 115 lb 9.6 oz (52.436 kg)     Physical Exam  Constitutional: She is oriented to person, place, and time. She appears well-developed and well-nourished. No distress.  HENT:  Head:  Normocephalic and atraumatic.  Right Ear: External ear normal.  Left Ear: External ear normal.  Nose: Nose normal.  Mouth/Throat: Oropharynx is clear and moist.  Eyes: Conjunctivae and EOM are normal. Pupils are equal, round, and reactive to light.  Neck: Normal range of motion. Neck supple. No thyromegaly present.  Cardiovascular:  Normal rate, regular rhythm and normal heart sounds.   No murmur heard. Pulmonary/Chest: Effort normal and breath sounds normal. No respiratory distress. She has no wheezes. She has no rales.  Abdominal: Soft. Bowel sounds are normal. She exhibits no distension. There is no tenderness.  Lymphadenopathy:    She has no cervical adenopathy.  Neurological: She is alert and oriented to person, place, and time. She has normal reflexes.  Skin: Skin is warm and dry.  Psychiatric: She has a normal mood and affect. Her behavior is normal. Judgment and thought content normal.     Lab Results  Component Value Date   WBC 5.2 05/08/2015   HCT 33.0* 05/08/2015   PLT 314 05/08/2015   GLUCOSE 93 05/08/2015   CHOL 157 05/08/2015   TRIG 128 05/08/2015   HDL 50 05/08/2015   LDLCALC 81 05/08/2015   ALT 7 05/08/2015   AST 14 05/08/2015   NA 142 05/08/2015   K 4.6 05/08/2015   CL 105 05/08/2015   CREATININE 1.59* 05/08/2015   BUN 24 05/08/2015   CO2 23 05/08/2015   TSH 4.010 11/05/2014    No results found.  Assessment & Plan:   Nikolina was seen today for hypertension, hyperlipidemia and osteoporosis.  Diagnoses and all orders for this visit:  HLD (hyperlipidemia) -     Cancel: CMP14+EGFR -     Cancel: Lipid panel -     CBC with Differential/Platelet -     CMP14+EGFR -     Lipid panel  Essential hypertension -     Cancel: CBC with Differential/Platelet -     Cancel: CMP14+EGFR -     CBC with Differential/Platelet -     CMP14+EGFR  Cough -     betamethasone acetate-betamethasone sodium phosphate (CELESTONE) injection 6 mg; Inject 1 mL (6 mg total) into the muscle once. -     CBC with Differential/Platelet -     CMP14+EGFR  CKD (chronic kidney disease), stage 3 (moderate) -     CBC with Differential/Platelet -     CMP14+EGFR  Postmenopause -     DG Bone Density; Future  Other orders -     NIFEdipine (PROCARDIA XL/ADALAT-CC) 60 MG 24 hr tablet; Take 1 tablet (60 mg total) by  mouth daily. -     allopurinol (ZYLOPRIM) 100 MG tablet; Take 1 tablet (100 mg total) by mouth daily. -     benzonatate (TESSALON) 200 MG capsule; Take 1 capsule (200 mg total) by mouth 3 (three) times daily as needed for cough.   I am having Ms. Heckard start on benzonatate. I am also having her maintain her Vitamin D, multivitamin, tacrolimus, lisinopril, raloxifene, NIFEdipine, and allopurinol. We administered betamethasone acetate-betamethasone sodium phosphate.  Meds ordered this encounter  Medications  . NIFEdipine (PROCARDIA XL/ADALAT-CC) 60 MG 24 hr tablet    Sig: Take 1 tablet (60 mg total) by mouth daily.    Dispense:  90 tablet    Refill:  1  . allopurinol (ZYLOPRIM) 100 MG tablet    Sig: Take 1 tablet (100 mg total) by mouth daily.    Dispense:  90 tablet    Refill:  1  .  betamethasone acetate-betamethasone sodium phosphate (CELESTONE) injection 6 mg    Sig:   . benzonatate (TESSALON) 200 MG capsule    Sig: Take 1 capsule (200 mg total) by mouth 3 (three) times daily as needed for cough.    Dispense:  30 capsule    Refill:  2   DASH Plan reviewed. Printed.  Follow-up: Return in about 6 months (around 11/05/2015).  Claretta Fraise, M.D.

## 2015-05-08 NOTE — Patient Instructions (Signed)
DASH Eating Plan  DASH stands for "Dietary Approaches to Stop Hypertension." The DASH eating plan is a healthy eating plan that has been shown to reduce high blood pressure (hypertension). Additional health benefits may include reducing the risk of type 2 diabetes mellitus, heart disease, and stroke. The DASH eating plan may also help with weight loss.  WHAT DO I NEED TO KNOW ABOUT THE DASH EATING PLAN?  For the DASH eating plan, you will follow these general guidelines:  · Choose foods with a percent daily value for sodium of less than 5% (as listed on the food label).  · Use salt-free seasonings or herbs instead of table salt or sea salt.  · Check with your health care provider or pharmacist before using salt substitutes.  · Eat lower-sodium products, often labeled as "lower sodium" or "no salt added."  · Eat fresh foods.  · Eat more vegetables, fruits, and low-fat dairy products.  · Choose whole grains. Look for the word "whole" as the first word in the ingredient list.  · Choose fish and skinless chicken or turkey more often than red meat. Limit fish, poultry, and meat to 6 oz (170 g) each day.  · Limit sweets, desserts, sugars, and sugary drinks.  · Choose heart-healthy fats.  · Limit cheese to 1 oz (28 g) per day.  · Eat more home-cooked food and less restaurant, buffet, and fast food.  · Limit fried foods.  · Cook foods using methods other than frying.  · Limit canned vegetables. If you do use them, rinse them well to decrease the sodium.  · When eating at a restaurant, ask that your food be prepared with less salt, or no salt if possible.  WHAT FOODS CAN I EAT?  Seek help from a dietitian for individual calorie needs.  Grains  Whole grain or whole wheat bread. Brown rice. Whole grain or whole wheat pasta. Quinoa, bulgur, and whole grain cereals. Low-sodium cereals. Corn or whole wheat flour tortillas. Whole grain cornbread. Whole grain crackers. Low-sodium crackers.  Vegetables  Fresh or frozen vegetables  (raw, steamed, roasted, or grilled). Low-sodium or reduced-sodium tomato and vegetable juices. Low-sodium or reduced-sodium tomato sauce and paste. Low-sodium or reduced-sodium canned vegetables.   Fruits  All fresh, canned (in natural juice), or frozen fruits.  Meat and Other Protein Products  Ground beef (85% or leaner), grass-fed beef, or beef trimmed of fat. Skinless chicken or turkey. Ground chicken or turkey. Pork trimmed of fat. All fish and seafood. Eggs. Dried beans, peas, or lentils. Unsalted nuts and seeds. Unsalted canned beans.  Dairy  Low-fat dairy products, such as skim or 1% milk, 2% or reduced-fat cheeses, low-fat ricotta or cottage cheese, or plain low-fat yogurt. Low-sodium or reduced-sodium cheeses.  Fats and Oils  Tub margarines without trans fats. Light or reduced-fat mayonnaise and salad dressings (reduced sodium). Avocado. Safflower, olive, or canola oils. Natural peanut or almond butter.  Other  Unsalted popcorn and pretzels.  The items listed above may not be a complete list of recommended foods or beverages. Contact your dietitian for more options.  WHAT FOODS ARE NOT RECOMMENDED?  Grains  White bread. White pasta. White rice. Refined cornbread. Bagels and croissants. Crackers that contain trans fat.  Vegetables  Creamed or fried vegetables. Vegetables in a cheese sauce. Regular canned vegetables. Regular canned tomato sauce and paste. Regular tomato and vegetable juices.  Fruits  Dried fruits. Canned fruit in light or heavy syrup. Fruit juice.  Meat and Other Protein   Products  Fatty cuts of meat. Ribs, chicken wings, bacon, sausage, bologna, salami, chitterlings, fatback, hot dogs, bratwurst, and packaged luncheon meats. Salted nuts and seeds. Canned beans with salt.  Dairy  Whole or 2% milk, cream, half-and-half, and cream cheese. Whole-fat or sweetened yogurt. Full-fat cheeses or blue cheese. Nondairy creamers and whipped toppings. Processed cheese, cheese spreads, or cheese  curds.  Condiments  Onion and garlic salt, seasoned salt, table salt, and sea salt. Canned and packaged gravies. Worcestershire sauce. Tartar sauce. Barbecue sauce. Teriyaki sauce. Soy sauce, including reduced sodium. Steak sauce. Fish sauce. Oyster sauce. Cocktail sauce. Horseradish. Ketchup and mustard. Meat flavorings and tenderizers. Bouillon cubes. Hot sauce. Tabasco sauce. Marinades. Taco seasonings. Relishes.  Fats and Oils  Butter, stick margarine, lard, shortening, ghee, and bacon fat. Coconut, palm kernel, or palm oils. Regular salad dressings.  Other  Pickles and olives. Salted popcorn and pretzels.  The items listed above may not be a complete list of foods and beverages to avoid. Contact your dietitian for more information.  WHERE CAN I FIND MORE INFORMATION?  National Heart, [employer], [employer] Institute: www.nhlbi.nih.gov/health/health-topics/topics/dash/     This information is not intended to replace advice given to you by your health care provider. Make sure you discuss any questions you have with your health care provider.     Document Released: 03/26/2011 Document Revised: 04/27/2014 Document Reviewed: 02/08/2013  [employer]vier Interactive Patient Education ©2016 [employer]

## 2015-05-09 LAB — LIPID PANEL
CHOL/HDL RATIO: 3.1 ratio (ref 0.0–4.4)
Cholesterol, Total: 157 mg/dL (ref 100–199)
HDL: 50 mg/dL (ref >39)
LDL Calculated: 81 mg/dL (ref 0–99)
Triglycerides: 128 mg/dL (ref 0–149)
VLDL Cholesterol Cal: 26 mg/dL (ref 5–40)

## 2015-05-09 LAB — CBC WITH DIFFERENTIAL/PLATELET
BASOS: 1 %
Basophils Absolute: 0 10*3/uL (ref 0.0–0.2)
EOS (ABSOLUTE): 0.1 10*3/uL (ref 0.0–0.4)
EOS: 3 %
HEMATOCRIT: 33 % — AB (ref 34.0–46.6)
Hemoglobin: 10.8 g/dL — ABNORMAL LOW (ref 11.1–15.9)
IMMATURE GRANS (ABS): 0 10*3/uL (ref 0.0–0.1)
Immature Granulocytes: 0 %
LYMPHS: 25 %
Lymphocytes Absolute: 1.3 10*3/uL (ref 0.7–3.1)
MCH: 31.6 pg (ref 26.6–33.0)
MCHC: 32.7 g/dL (ref 31.5–35.7)
MCV: 97 fL (ref 79–97)
Monocytes Absolute: 0.4 10*3/uL (ref 0.1–0.9)
Monocytes: 8 %
NEUTROS ABS: 3.3 10*3/uL (ref 1.4–7.0)
Neutrophils: 63 %
PLATELETS: 314 10*3/uL (ref 150–379)
RBC: 3.42 x10E6/uL — ABNORMAL LOW (ref 3.77–5.28)
RDW: 13.4 % (ref 12.3–15.4)
WBC: 5.2 10*3/uL (ref 3.4–10.8)

## 2015-05-09 LAB — CMP14+EGFR
A/G RATIO: 1.4 (ref 1.1–2.5)
ALBUMIN: 4 g/dL (ref 3.5–4.8)
ALT: 7 IU/L (ref 0–32)
AST: 14 IU/L (ref 0–40)
Alkaline Phosphatase: 50 IU/L (ref 39–117)
BILIRUBIN TOTAL: 0.3 mg/dL (ref 0.0–1.2)
BUN / CREAT RATIO: 15 (ref 11–26)
BUN: 24 mg/dL (ref 8–27)
CHLORIDE: 105 mmol/L (ref 96–106)
CO2: 23 mmol/L (ref 18–29)
Calcium: 9.4 mg/dL (ref 8.7–10.3)
Creatinine, Ser: 1.59 mg/dL — ABNORMAL HIGH (ref 0.57–1.00)
GFR, EST AFRICAN AMERICAN: 36 mL/min/{1.73_m2} — AB (ref >59)
GFR, EST NON AFRICAN AMERICAN: 32 mL/min/{1.73_m2} — AB (ref >59)
GLOBULIN, TOTAL: 2.8 g/dL (ref 1.5–4.5)
Glucose: 93 mg/dL (ref 65–99)
POTASSIUM: 4.6 mmol/L (ref 3.5–5.2)
SODIUM: 142 mmol/L (ref 134–144)
TOTAL PROTEIN: 6.8 g/dL (ref 6.0–8.5)

## 2015-05-15 LAB — FERRITIN: FERRITIN: 91 ng/mL (ref 15–150)

## 2015-05-15 LAB — IRON AND TIBC
IRON SATURATION: 35 % (ref 15–55)
IRON: 73 ug/dL (ref 27–139)
Total Iron Binding Capacity: 210 ug/dL — ABNORMAL LOW (ref 250–450)
UIBC: 137 ug/dL (ref 118–369)

## 2015-05-15 LAB — SPECIMEN STATUS REPORT

## 2015-05-15 LAB — ERYTHROPOIETIN: Erythropoietin: 11.8 m[IU]/mL (ref 2.6–18.5)

## 2015-05-16 ENCOUNTER — Other Ambulatory Visit: Payer: Medicare Other

## 2015-07-11 DIAGNOSIS — T1490 Injury, unspecified: Secondary | ICD-10-CM | POA: Diagnosis not present

## 2015-07-11 DIAGNOSIS — T148 Other injury of unspecified body region: Secondary | ICD-10-CM | POA: Diagnosis not present

## 2015-11-06 ENCOUNTER — Encounter: Payer: Self-pay | Admitting: Family Medicine

## 2015-11-06 ENCOUNTER — Ambulatory Visit (INDEPENDENT_AMBULATORY_CARE_PROVIDER_SITE_OTHER): Payer: Medicare Other | Admitting: Family Medicine

## 2015-11-06 VITALS — BP 133/79 | HR 99 | Temp 97.3°F | Ht 61.25 in | Wt 114.5 lb

## 2015-11-06 DIAGNOSIS — N183 Chronic kidney disease, stage 3 (moderate): Secondary | ICD-10-CM

## 2015-11-06 DIAGNOSIS — M1000 Idiopathic gout, unspecified site: Secondary | ICD-10-CM | POA: Diagnosis not present

## 2015-11-06 DIAGNOSIS — D638 Anemia in other chronic diseases classified elsewhere: Secondary | ICD-10-CM

## 2015-11-06 DIAGNOSIS — M810 Age-related osteoporosis without current pathological fracture: Secondary | ICD-10-CM | POA: Diagnosis not present

## 2015-11-06 DIAGNOSIS — I10 Essential (primary) hypertension: Secondary | ICD-10-CM

## 2015-11-06 NOTE — Progress Notes (Signed)
Subjective:  Patient ID: Tamara Gross, female    DOB: 1940-09-16  Age: 75 y.o. MRN: [id number]  CC: 6 month follow up   HPI Astaria B Tomaselli presents for  follow-up of hypertension. Patient has no history of headache chest pain or shortness of breath or recent cough. Patient also denies symptoms of TIA such as numbness weakness lateralizing. Patient checks  blood pressure at home and has not had any elevated readings recently. Patient denies side effects from his medication. States taking it regularly.  Patient also  in for follow-up of cholesterol.Level has been good without med.Follows careful diet. Taking allopurinol for gout prevention. Denies attack recently. She denies history of falling.  Taking Evista for osteoporosis. OnlySide effect mentioned in his breast tenderness minor and intermittent.  History Kailei has a past medical history of Gout; Hyperlipidemia; Osteoporosis; and Hypertension.   She has past surgical history that includes Tonsillectomy.   Her family history includes Deep vein thrombosis in her father.She reports that she has never smoked. She does not have any smokeless tobacco history on file. Her alcohol and drug histories are not on file.  Current Outpatient Prescriptions on File Prior to Visit  Medication Sig Dispense Refill  . allopurinol (ZYLOPRIM) 100 MG tablet Take 1 tablet (100 mg total) by mouth daily. 90 tablet 1  . Cholecalciferol (VITAMIN D) 2000 UNITS CAPS Take 1 capsule by mouth.    Marland Kitchen lisinopril (PRINIVIL,ZESTRIL) 20 MG tablet Take 1 tablet (20 mg total) by mouth daily. 90 tablet 3  . Multiple Vitamin (MULTIVITAMIN) tablet Take 1 tablet by mouth daily.    Marland Kitchen NIFEdipine (PROCARDIA XL/ADALAT-CC) 60 MG 24 hr tablet Take 1 tablet (60 mg total) by mouth daily. 90 tablet 1  . raloxifene (EVISTA) 60 MG tablet Take 1 tablet (60 mg total) by mouth daily. 90 tablet 3   No current facility-administered medications on file prior to visit.    ROS Review of  Systems  Constitutional: Negative for fever, activity change and appetite change.  HENT: Negative for congestion, rhinorrhea and sore throat.   Eyes: Negative for visual disturbance.  Respiratory: Negative for cough and shortness of breath.   Cardiovascular: Negative for chest pain and palpitations.  Gastrointestinal: Negative for nausea, abdominal pain and diarrhea.  Genitourinary: Negative for dysuria.  Musculoskeletal: Negative for myalgias and arthralgias.    Objective:  BP 133/79 mmHg  Pulse 99  Temp(Src) 97.3 F (36.3 C) (Oral)  Ht 5' 1.25" (1.556 m)  Wt 114 lb 8 oz (51.937 kg)  BMI 21.45 kg/m2  BP Readings from Last 3 Encounters:  11/06/15 133/79  05/08/15 142/80  11/05/14 129/81    Wt Readings from Last 3 Encounters:  11/06/15 114 lb 8 oz (51.937 kg)  05/08/15 116 lb 9.6 oz (52.889 kg)  11/05/14 115 lb 3.2 oz (52.254 kg)     Physical Exam  Constitutional: She is oriented to person, place, and time. She appears well-developed and well-nourished. No distress.  HENT:  Head: Normocephalic and atraumatic.  Right Ear: External ear normal.  Left Ear: External ear normal.  Nose: Nose normal.  Mouth/Throat: Oropharynx is clear and moist.  Eyes: Conjunctivae and EOM are normal. Pupils are equal, round, and reactive to light.  Neck: Normal range of motion. Neck supple. No thyromegaly present.  Cardiovascular: Normal rate, regular rhythm and normal heart sounds.   No murmur heard. Pulmonary/Chest: Effort normal and breath sounds normal. No respiratory distress. She has no wheezes. She has no rales.  Abdominal:  Soft. Bowel sounds are normal. She exhibits no distension. There is no tenderness.  Lymphadenopathy:    She has no cervical adenopathy.  Neurological: She is alert and oriented to person, place, and time. She has normal reflexes.  Skin: Skin is warm and dry.  Psychiatric: She has a normal mood and affect. Her behavior is normal. Judgment and thought content  normal.    No results found for: HGBA1C  Lab Results  Component Value Date   WBC 5.2 05/08/2015   HCT 33.0* 05/08/2015   PLT 314 05/08/2015   GLUCOSE 93 05/08/2015   CHOL 157 05/08/2015   TRIG 128 05/08/2015   HDL 50 05/08/2015   LDLCALC 81 05/08/2015   ALT 7 05/08/2015   AST 14 05/08/2015   NA 142 05/08/2015   K 4.6 05/08/2015   CL 105 05/08/2015   CREATININE 1.59* 05/08/2015   BUN 24 05/08/2015   CO2 23 05/08/2015   TSH 4.010 11/05/2014      Assessment & Plan:   [name] was seen today for 6 month follow up.  Diagnoses and all orders for this visit:  Essential hypertension -     CBC with Differential/Platelet -     CMP14+EGFR -     Lipid panel  Osteoporosis -     CBC with Differential/Platelet -     CMP14+EGFR -     Lipid panel  CKD (chronic kidney disease), stage 3 (moderate) -     CBC with Differential/Platelet -     CMP14+EGFR -     Lipid panel  Anemia of chronic disease -     CBC with Differential/Platelet -     CMP14+EGFR -     Lipid panel  Idiopathic gout, unspecified chronicity, unspecified site -     Uric acid -     CBC with Differential/Platelet -     CMP14+EGFR -     Lipid panel   I have discontinued Ms. Winchel's tacrolimus and benzonatate. I am also having her maintain her Vitamin D, multivitamin, lisinopril, raloxifene, NIFEdipine, and allopurinol.  No orders of the defined types were placed in this encounter.     Follow-up: Return in about 6 months (around 05/08/2016) for hypertension.  Claretta Fraise, M.D.

## 2015-11-07 LAB — CBC WITH DIFFERENTIAL/PLATELET
BASOS ABS: 0 10*3/uL (ref 0.0–0.2)
Basos: 1 %
EOS (ABSOLUTE): 0.2 10*3/uL (ref 0.0–0.4)
EOS: 3 %
HEMATOCRIT: 36.8 % (ref 34.0–46.6)
HEMOGLOBIN: 11.7 g/dL (ref 11.1–15.9)
IMMATURE GRANULOCYTES: 0 %
Immature Grans (Abs): 0 10*3/uL (ref 0.0–0.1)
LYMPHS ABS: 1.2 10*3/uL (ref 0.7–3.1)
LYMPHS: 21 %
MCH: 31.5 pg (ref 26.6–33.0)
MCHC: 31.8 g/dL (ref 31.5–35.7)
MCV: 99 fL — ABNORMAL HIGH (ref 79–97)
MONOCYTES: 9 %
Monocytes Absolute: 0.5 10*3/uL (ref 0.1–0.9)
Neutrophils Absolute: 3.8 10*3/uL (ref 1.4–7.0)
Neutrophils: 66 %
Platelets: 314 10*3/uL (ref 150–379)
RBC: 3.71 x10E6/uL — ABNORMAL LOW (ref 3.77–5.28)
RDW: 14 % (ref 12.3–15.4)
WBC: 5.7 10*3/uL (ref 3.4–10.8)

## 2015-11-07 LAB — CMP14+EGFR
ALBUMIN: 4.2 g/dL (ref 3.5–4.8)
ALK PHOS: 51 IU/L (ref 39–117)
ALT: 9 IU/L (ref 0–32)
AST: 14 IU/L (ref 0–40)
Albumin/Globulin Ratio: 1.6 (ref 1.2–2.2)
BUN/Creatinine Ratio: 12 (ref 12–28)
BUN: 21 mg/dL (ref 8–27)
Bilirubin Total: 0.3 mg/dL (ref 0.0–1.2)
CALCIUM: 9.2 mg/dL (ref 8.7–10.3)
CO2: 23 mmol/L (ref 18–29)
CREATININE: 1.72 mg/dL — AB (ref 0.57–1.00)
Chloride: 101 mmol/L (ref 96–106)
GFR calc Af Amer: 33 mL/min/{1.73_m2} — ABNORMAL LOW (ref >59)
GFR, EST NON AFRICAN AMERICAN: 29 mL/min/{1.73_m2} — AB (ref >59)
GLUCOSE: 94 mg/dL (ref 65–99)
Globulin, Total: 2.7 g/dL (ref 1.5–4.5)
Potassium: 4.3 mmol/L (ref 3.5–5.2)
SODIUM: 140 mmol/L (ref 134–144)
Total Protein: 6.9 g/dL (ref 6.0–8.5)

## 2015-11-07 LAB — LIPID PANEL
CHOLESTEROL TOTAL: 160 mg/dL (ref 100–199)
Chol/HDL Ratio: 3.1 ratio units (ref 0.0–4.4)
HDL: 51 mg/dL (ref >39)
LDL CALC: 86 mg/dL (ref 0–99)
TRIGLYCERIDES: 113 mg/dL (ref 0–149)
VLDL CHOLESTEROL CAL: 23 mg/dL (ref 5–40)

## 2015-11-07 LAB — URIC ACID: URIC ACID: 6.2 mg/dL (ref 2.5–7.1)

## 2015-11-08 ENCOUNTER — Telehealth: Payer: Self-pay | Admitting: *Deleted

## 2015-11-08 ENCOUNTER — Other Ambulatory Visit: Payer: Self-pay | Admitting: *Deleted

## 2015-11-08 DIAGNOSIS — Z780 Asymptomatic menopausal state: Secondary | ICD-10-CM

## 2015-11-08 MED ORDER — LISINOPRIL 20 MG PO TABS: 20.0000 mg | ORAL_TABLET | Freq: Every day | ORAL | Status: DC

## 2015-11-08 MED ORDER — ALLOPURINOL 100 MG PO TABS: 100.0000 mg | ORAL_TABLET | Freq: Every day | ORAL | Status: DC

## 2015-11-08 MED ORDER — NIFEDIPINE ER OSMOTIC RELEASE 60 MG PO TB24: 60.0000 mg | ORAL_TABLET | Freq: Every day | ORAL | Status: DC

## 2015-11-08 MED ORDER — RALOXIFENE HCL 60 MG PO TABS: 60.0000 mg | ORAL_TABLET | Freq: Every day | ORAL | Status: DC

## 2015-11-08 NOTE — Progress Notes (Signed)
Pt needed refills on medications meds okayed per Dr Darlyn ReadStacks Lab results reviewed with pt

## 2015-11-08 NOTE — Telephone Encounter (Signed)
Pt notified of results Verbalizes understanding 

## 2015-11-08 NOTE — Telephone Encounter (Signed)
-----   Message from Mechele ClaudeWarren Stacks, MD sent at 11/08/2015  8:41 AM EDT ----- Hello Tamara Gross,    Your lab result is normal.Some minor variations that are not significant are commonly marked abnormal, but do not represent any medical problem for you.  Best regards, Mechele ClaudeWarren Stacks, M.D.

## 2016-01-20 ENCOUNTER — Ambulatory Visit (INDEPENDENT_AMBULATORY_CARE_PROVIDER_SITE_OTHER): Payer: Medicare Other

## 2016-01-20 DIAGNOSIS — Z23 Encounter for immunization: Secondary | ICD-10-CM

## 2016-05-07 ENCOUNTER — Other Ambulatory Visit: Payer: Self-pay | Admitting: *Deleted

## 2016-05-07 MED ORDER — NIFEDIPINE ER OSMOTIC RELEASE 60 MG PO TB24: 60.0000 mg | ORAL_TABLET | Freq: Every day | ORAL | 1 refills | Status: DC

## 2016-05-08 ENCOUNTER — Ambulatory Visit: Payer: Medicare Other | Admitting: Family Medicine

## 2016-05-13 ENCOUNTER — Ambulatory Visit (INDEPENDENT_AMBULATORY_CARE_PROVIDER_SITE_OTHER): Payer: Medicare Other | Admitting: Family Medicine

## 2016-05-13 ENCOUNTER — Encounter: Payer: Self-pay | Admitting: Family Medicine

## 2016-05-13 VITALS — BP 134/80 | HR 87 | Temp 98.0°F | Ht 61.25 in | Wt 115.0 lb

## 2016-05-13 DIAGNOSIS — E559 Vitamin D deficiency, unspecified: Secondary | ICD-10-CM | POA: Diagnosis not present

## 2016-05-13 DIAGNOSIS — D638 Anemia in other chronic diseases classified elsewhere: Secondary | ICD-10-CM | POA: Diagnosis not present

## 2016-05-13 DIAGNOSIS — M810 Age-related osteoporosis without current pathological fracture: Secondary | ICD-10-CM

## 2016-05-13 DIAGNOSIS — E782 Mixed hyperlipidemia: Secondary | ICD-10-CM

## 2016-05-13 DIAGNOSIS — M1000 Idiopathic gout, unspecified site: Secondary | ICD-10-CM | POA: Diagnosis not present

## 2016-05-13 DIAGNOSIS — I10 Essential (primary) hypertension: Secondary | ICD-10-CM

## 2016-05-13 NOTE — Progress Notes (Signed)
Subjective:  Patient ID: Tamara Gross, female    DOB: 1941/02/20  Age: 76 y.o. MRN: [id number]  CC: Hypertension (pt here today for routine follow up on her HTN, no problems voiced)   HPI Tamara Gross presents for  follow-up of hypertension. Patient has no history of headache chest pain or shortness of breath or recent cough. Patient also denies symptoms of TIA such as numbness weakness lateralizing. Patient checks  blood pressure at home and has not had any elevated readings recently. Patient denies side effects from his medication. States taking it regularly.  Denies any fractures or excessive bone pain related to her osteoporosis. She does continue to use Avista. She has not had a bone density test recently. Also has not had mammogram. She is willing to have both done soon.  Chart review shows that her cholesterol is quite good without medication several months ago. We will retire the diagnosis of hyperlipidemia. Additionally will check that only with annual exams periodically. She will also be checked a bit less frequently for her uric acid since the level is 20 white well and she's not had any outbreaks in years.   History Tamara Gross has a past medical history of Gout; Hyperlipidemia; Hypertension; and Osteoporosis.   She has a past surgical history that includes Tonsillectomy.   Her family history includes Deep vein thrombosis in her father.She reports that she has never smoked. She does not have any smokeless tobacco history on file. Her alcohol and drug histories are not on file.    ROS Review of Systems  Constitutional: Negative for activity change, appetite change and fever.  HENT: Negative for congestion, rhinorrhea and sore throat.   Eyes: Negative for visual disturbance.  Respiratory: Negative for cough and shortness of breath.   Cardiovascular: Negative for chest pain and palpitations.  Gastrointestinal: Negative for abdominal pain, diarrhea and nausea.  Genitourinary:  Negative for dysuria.  Musculoskeletal: Negative for arthralgias and myalgias.    Objective:  BP 134/80   Pulse 87   Temp 98 F (36.7 C) (Oral)   Ht 5' 1.25" (1.556 m)   Wt 115 lb (52.2 kg)   BMI 21.55 kg/m   BP Readings from Last 3 Encounters:  05/13/16 134/80  11/06/15 133/79  05/08/15 (!) 142/80    Wt Readings from Last 3 Encounters:  05/13/16 115 lb (52.2 kg)  11/06/15 114 lb 8 oz (51.9 kg)  05/08/15 116 lb 9.6 oz (52.9 kg)     Physical Exam  Constitutional: She is oriented to person, place, and time. She appears well-developed and well-nourished. No distress.  HENT:  Head: Normocephalic and atraumatic.  Right Ear: External ear normal.  Left Ear: External ear normal.  Nose: Nose normal.  Mouth/Throat: Oropharynx is clear and moist.  Eyes: Conjunctivae and EOM are normal. Pupils are equal, round, and reactive to light.  Neck: Normal range of motion. Neck supple. No thyromegaly present.  Cardiovascular: Normal rate, regular rhythm and normal heart sounds.   No murmur heard. Pulmonary/Chest: Effort normal and breath sounds normal. No respiratory distress. She has no wheezes. She has no rales.  Abdominal: Soft. Bowel sounds are normal. She exhibits no distension. There is no tenderness.  Lymphadenopathy:    She has no cervical adenopathy.  Neurological: She is alert and oriented to person, place, and time. She has normal reflexes.  Skin: Skin is warm and dry.  Psychiatric: She has a normal mood and affect. Her behavior is normal. Judgment and thought content normal.  No results found.  Assessment & Plan:   Tamara Gross was seen today for hypertension.  Diagnoses and all orders for this visit:  Mixed hyperlipidemia -     CMP14+EGFR  Essential hypertension -     CMP14+EGFR  Age-related osteoporosis without current pathological fracture  Idiopathic gout, unspecified chronicity, unspecified site -     CMP14+EGFR  Vitamin D deficiency -      CMP14+EGFR  Anemia of chronic disease -     CBC with Differential/Platelet -     CMP14+EGFR   I am having [name] maintain her Vitamin D, multivitamin, allopurinol, lisinopril, raloxifene, and NIFEdipine.  Allergies as of 05/13/2016   No Known Allergies     Medication List       Accurate as of 05/13/16 10:41 AM. Always use your most recent med list.          allopurinol 100 MG tablet Commonly known as:  ZYLOPRIM Take 1 tablet (100 mg total) by mouth daily.   lisinopril 20 MG tablet Commonly known as:  PRINIVIL,ZESTRIL Take 1 tablet (20 mg total) by mouth daily.   multivitamin tablet Take 1 tablet by mouth daily.   NIFEdipine 60 MG 24 hr tablet Commonly known as:  PROCARDIA XL/ADALAT-CC Take 1 tablet (60 mg total) by mouth daily.   raloxifene 60 MG tablet Commonly known as:  EVISTA Take 1 tablet (60 mg total) by mouth daily.   Vitamin D 2000 units Caps Take 1 capsule by mouth.        Follow-up: Return in about 6 months (around 11/10/2016) for CPE, hypertension.  Claretta Fraise, M.D.

## 2016-05-14 LAB — CBC WITH DIFFERENTIAL/PLATELET
BASOS: 1 %
Basophils Absolute: 0 10*3/uL (ref 0.0–0.2)
EOS (ABSOLUTE): 0.2 10*3/uL (ref 0.0–0.4)
EOS: 3 %
HEMOGLOBIN: 10.8 g/dL — AB (ref 11.1–15.9)
Hematocrit: 33.1 % — ABNORMAL LOW (ref 34.0–46.6)
IMMATURE GRANS (ABS): 0 10*3/uL (ref 0.0–0.1)
IMMATURE GRANULOCYTES: 0 %
LYMPHS: 23 %
Lymphocytes Absolute: 1.3 10*3/uL (ref 0.7–3.1)
MCH: 31.8 pg (ref 26.6–33.0)
MCHC: 32.6 g/dL (ref 31.5–35.7)
MCV: 97 fL (ref 79–97)
Monocytes Absolute: 0.6 10*3/uL (ref 0.1–0.9)
Monocytes: 10 %
NEUTROS ABS: 3.6 10*3/uL (ref 1.4–7.0)
NEUTROS PCT: 63 %
PLATELETS: 310 10*3/uL (ref 150–379)
RBC: 3.4 x10E6/uL — ABNORMAL LOW (ref 3.77–5.28)
RDW: 13.6 % (ref 12.3–15.4)
WBC: 5.7 10*3/uL (ref 3.4–10.8)

## 2016-05-14 LAB — CMP14+EGFR
ALK PHOS: 66 IU/L (ref 39–117)
ALT: 10 IU/L (ref 0–32)
AST: 12 IU/L (ref 0–40)
Albumin/Globulin Ratio: 1.6 (ref 1.2–2.2)
Albumin: 4.1 g/dL (ref 3.5–4.8)
BILIRUBIN TOTAL: 0.3 mg/dL (ref 0.0–1.2)
BUN/Creatinine Ratio: 11 — ABNORMAL LOW (ref 12–28)
BUN: 18 mg/dL (ref 8–27)
CHLORIDE: 104 mmol/L (ref 96–106)
CO2: 22 mmol/L (ref 18–29)
CREATININE: 1.57 mg/dL — AB (ref 0.57–1.00)
Calcium: 9.6 mg/dL (ref 8.7–10.3)
GFR calc Af Amer: 37 mL/min/{1.73_m2} — ABNORMAL LOW (ref >59)
GFR calc non Af Amer: 32 mL/min/{1.73_m2} — ABNORMAL LOW (ref >59)
Globulin, Total: 2.5 g/dL (ref 1.5–4.5)
Glucose: 93 mg/dL (ref 65–99)
Potassium: 4.5 mmol/L (ref 3.5–5.2)
Sodium: 141 mmol/L (ref 134–144)
TOTAL PROTEIN: 6.6 g/dL (ref 6.0–8.5)

## 2016-11-10 ENCOUNTER — Ambulatory Visit (INDEPENDENT_AMBULATORY_CARE_PROVIDER_SITE_OTHER): Payer: Medicare Other | Admitting: Family Medicine

## 2016-11-10 ENCOUNTER — Encounter: Payer: Self-pay | Admitting: Family Medicine

## 2016-11-10 VITALS — BP 128/82 | HR 84 | Temp 97.4°F | Ht 61.25 in | Wt 113.1 lb

## 2016-11-10 DIAGNOSIS — I10 Essential (primary) hypertension: Secondary | ICD-10-CM

## 2016-11-10 DIAGNOSIS — E782 Mixed hyperlipidemia: Secondary | ICD-10-CM

## 2016-11-10 DIAGNOSIS — D638 Anemia in other chronic diseases classified elsewhere: Secondary | ICD-10-CM | POA: Diagnosis not present

## 2016-11-10 DIAGNOSIS — E559 Vitamin D deficiency, unspecified: Secondary | ICD-10-CM

## 2016-11-10 DIAGNOSIS — Z780 Asymptomatic menopausal state: Secondary | ICD-10-CM | POA: Diagnosis not present

## 2016-11-10 DIAGNOSIS — M1000 Idiopathic gout, unspecified site: Secondary | ICD-10-CM

## 2016-11-10 DIAGNOSIS — M810 Age-related osteoporosis without current pathological fracture: Secondary | ICD-10-CM

## 2016-11-10 MED ORDER — RALOXIFENE HCL 60 MG PO TABS: 60.0000 mg | ORAL_TABLET | Freq: Every day | ORAL | 3 refills | Status: DC

## 2016-11-10 MED ORDER — NIFEDIPINE ER OSMOTIC RELEASE 60 MG PO TB24: 60.0000 mg | ORAL_TABLET | Freq: Every day | ORAL | 3 refills | Status: DC

## 2016-11-10 MED ORDER — LISINOPRIL 20 MG PO TABS: 20.0000 mg | ORAL_TABLET | Freq: Every day | ORAL | 3 refills | Status: DC

## 2016-11-10 MED ORDER — RISEDRONATE SODIUM 150 MG PO TABS
150.0000 mg | ORAL_TABLET | ORAL | 3 refills | Status: DC
Start: 2016-11-10 — End: 2016-11-10

## 2016-11-10 MED ORDER — ALLOPURINOL 100 MG PO TABS: 100.0000 mg | ORAL_TABLET | Freq: Every day | ORAL | 3 refills | Status: DC

## 2016-11-10 MED ORDER — RISEDRONATE SODIUM 150 MG PO TABS: 150.0000 mg | ORAL_TABLET | ORAL | 3 refills | Status: DC

## 2016-11-10 NOTE — Addendum Note (Signed)
Addended by: Mechele ClaudeSTACKS, Onie Hayashi on: 11/10/2016 07:17 PM   Modules accepted: Level of Service

## 2016-11-10 NOTE — Addendum Note (Signed)
Addended by: Mechele ClaudeSTACKS, Meryl Hubers on: 11/10/2016 10:14 AM   Modules accepted: Orders

## 2016-11-10 NOTE — Progress Notes (Addendum)
Subjective:  Patient ID: Tamara Gross, female    DOB: 12-Jul-1940  Age: 76 y.o. MRN: [id number]  CC: Annual Exam (pt here today for CPE and medication refills.)   HPI Tamara Gross presents for Physical plus follow-up of her chronic conditions. She had to discontinue the Evista because it made her legs ache. A few days after she quit taking the aching went away. She's not had any return and she does not have a significant history of arthritis.  Follow-up hypertension:  follow-up of hypertension. Patient has no history of headache chest pain or shortness of breath or recent cough. Patient also denies symptoms of TIA such as numbness weakness lateralizing. Patient checks  blood pressure at home and has not had any elevated readings recently. Patient denies side effects from his medication. States taking it regularly. Patient denies any gout outbreaks recently. She went off of her allopurinol for a little while but went back on it. Patient tries to follow a low-fat diet for her cholesterol. For instance she avoids bacon although she likes it and will occasionally give in and have soft. Her colonoscopy was done in 2012 and is not due for another 4 years. Results showed that she was free of polyps but did have diverticulosis.   Depression screen [facility] 2/9 11/10/2016 05/13/2016 11/06/2015  Decreased Interest 0 0 0  Down, Depressed, Hopeless 0 0 0  PHQ - 2 Score 0 0 0    History Tamara Gross has a past medical history of Gout; Hyperlipidemia; Hypertension; and Osteoporosis.   She has a past surgical history that includes Tonsillectomy.   Her family history includes Deep vein thrombosis in her father.She reports that she has never smoked. She has never used smokeless tobacco. Her alcohol and drug histories are not on file.    ROS Review of Systems  Constitutional: Negative for appetite change, chills, diaphoresis, fatigue, fever and unexpected weight change.  HENT: Negative for congestion, ear pain,  hearing loss, postnasal drip, rhinorrhea, sneezing, sore throat and trouble swallowing.   Eyes: Negative for pain.  Respiratory: Negative for cough, chest tightness and shortness of breath.   Cardiovascular: Negative for chest pain and palpitations.  Gastrointestinal: Negative for abdominal pain, constipation, diarrhea, nausea and vomiting.  Endocrine: Negative for cold intolerance, heat intolerance, polydipsia, polyphagia and polyuria.  Genitourinary: Negative for dysuria, frequency and menstrual problem.  Musculoskeletal: Negative for arthralgias and joint swelling.  Skin: Negative for rash.  Allergic/Immunologic: Negative for environmental allergies.  Neurological: Negative for dizziness, weakness, numbness and headaches.  Psychiatric/Behavioral: Negative for agitation and dysphoric mood.    Objective:  BP 128/82   Pulse 84   Temp (!) 97.4 F (36.3 C) (Oral)   Ht 5' 1.25" (1.556 m)   Wt 113 lb 2 oz (51.3 kg)   BMI 21.20 kg/m   BP Readings from Last 3 Encounters:  11/10/16 128/82  05/13/16 134/80  11/06/15 133/79    Wt Readings from Last 3 Encounters:  11/10/16 113 lb 2 oz (51.3 kg)  05/13/16 115 lb (52.2 kg)  11/06/15 114 lb 8 oz (51.9 kg)     Physical Exam  Constitutional: She is oriented to person, place, and time. She appears well-developed and well-nourished. No distress.  HENT:  Head: Normocephalic and atraumatic.  Right Ear: External ear normal.  Left Ear: External ear normal.  Nose: Nose normal.  Mouth/Throat: Oropharynx is clear and moist.  Eyes: Pupils are equal, round, and reactive to light. Conjunctivae and EOM are normal.  Neck: Normal range of motion. Neck supple. No thyromegaly present.  Cardiovascular: Normal rate, regular rhythm and normal heart sounds.   No murmur heard. Pulmonary/Chest: Effort normal and breath sounds normal. No respiratory distress. She has no wheezes. She has no rales. Right breast exhibits no inverted nipple, no mass and no  tenderness. Left breast exhibits no inverted nipple, no mass and no tenderness. Breasts are symmetrical.  Abdominal: Soft. Normal appearance and bowel sounds are normal. She exhibits no distension, no abdominal bruit and no mass. There is no splenomegaly or hepatomegaly. There is no tenderness. There is no tenderness at McBurney's point and negative Murphy's sign.  Musculoskeletal: Normal range of motion. She exhibits no edema or tenderness.  Lymphadenopathy:    She has no cervical adenopathy.  Neurological: She is alert and oriented to person, place, and time. She has normal reflexes.  Skin: Skin is warm and dry. No rash noted.  Psychiatric: She has a normal mood and affect. Her behavior is normal. Judgment and thought content normal.      Assessment & Plan:   Tamara Gross was seen today for annual exam.  Diagnoses and all orders for this visit:  Postmenopausal -     Discontinue: raloxifene (EVISTA) 60 MG tablet; Take 1 tablet (60 mg total) by mouth daily. -     DG Bone Density; Future -     MM SCREENING BREAST TOMO BILATERAL; Future  Essential hypertension -     CBC with Differential/Platelet -     CMP14+EGFR -     Urinalysis  Mixed hyperlipidemia -     CMP14+EGFR -     Lipid panel -     TSH  Age-related osteoporosis without current pathological fracture -     CMP14+EGFR -     TSH -     VITAMIN D 25 Hydroxy (Vit-D Deficiency, Fractures)  Idiopathic gout, unspecified chronicity, unspecified site -     CMP14+EGFR -     Urinalysis -     Uric acid  Vitamin D deficiency -     CBC with Differential/Platelet -     CMP14+EGFR -     VITAMIN D 25 Hydroxy (Vit-D Deficiency, Fractures)  Anemia of chronic disease -     CBC with Differential/Platelet -     CMP14+EGFR  Postmenopause -     CMP14+EGFR  Other orders -     allopurinol (ZYLOPRIM) 100 MG tablet; Take 1 tablet (100 mg total) by mouth daily. -     lisinopril (PRINIVIL,ZESTRIL) 20 MG tablet; Take 1 tablet (20 mg total) by  mouth daily. -     NIFEdipine (PROCARDIA XL/ADALAT-CC) 60 MG 24 hr tablet; Take 1 tablet (60 mg total) by mouth daily. -     Discontinue: risedronate (ACTONEL) 150 MG tablet; Take 1 tablet (150 mg total) by mouth every 30 (thirty) days. -     risedronate (ACTONEL) 150 MG tablet; Take 1 tablet (150 mg total) by mouth every 30 (thirty) days.       I have discontinued Tamara Gross's raloxifene and raloxifene. I am also having her maintain her Vitamin D, multivitamin, allopurinol, lisinopril, NIFEdipine, and risedronate.  Allergies as of 11/10/2016   No Known Allergies     Medication List       Accurate as of 11/10/16  7:17 PM. Always use your most recent med list.          allopurinol 100 MG tablet Commonly known as:  ZYLOPRIM Take 1 tablet (100 mg total)  by mouth daily.   lisinopril 20 MG tablet Commonly known as:  PRINIVIL,ZESTRIL Take 1 tablet (20 mg total) by mouth daily.   multivitamin tablet Take 1 tablet by mouth daily.   NIFEdipine 60 MG 24 hr tablet Commonly known as:  PROCARDIA XL/ADALAT-CC Take 1 tablet (60 mg total) by mouth daily.   risedronate 150 MG tablet Commonly known as:  ACTONEL Take 1 tablet (150 mg total) by mouth every 30 (thirty) days.   Vitamin D 2000 units Caps Take 1 capsule by mouth.        Follow-up: Return in about 6 months (around 05/13/2017) for hypertension.  Claretta Fraise, M.D.

## 2016-11-11 ENCOUNTER — Other Ambulatory Visit: Payer: Self-pay | Admitting: Family Medicine

## 2016-11-11 LAB — TSH: TSH: 3.71 u[IU]/mL (ref 0.450–4.500)

## 2016-11-11 LAB — CMP14+EGFR
ALBUMIN: 4.3 g/dL (ref 3.5–4.8)
ALT: 7 IU/L (ref 0–32)
AST: 13 IU/L (ref 0–40)
Albumin/Globulin Ratio: 1.5 (ref 1.2–2.2)
Alkaline Phosphatase: 65 IU/L (ref 39–117)
BUN / CREAT RATIO: 14 (ref 12–28)
BUN: 21 mg/dL (ref 8–27)
Bilirubin Total: 0.4 mg/dL (ref 0.0–1.2)
CO2: 21 mmol/L (ref 20–29)
CREATININE: 1.52 mg/dL — AB (ref 0.57–1.00)
Calcium: 10 mg/dL (ref 8.7–10.3)
Chloride: 102 mmol/L (ref 96–106)
GFR calc Af Amer: 38 mL/min/{1.73_m2} — ABNORMAL LOW (ref >59)
GFR, EST NON AFRICAN AMERICAN: 33 mL/min/{1.73_m2} — AB (ref >59)
GLUCOSE: 95 mg/dL (ref 65–99)
Globulin, Total: 2.8 g/dL (ref 1.5–4.5)
Potassium: 4.3 mmol/L (ref 3.5–5.2)
Sodium: 140 mmol/L (ref 134–144)
TOTAL PROTEIN: 7.1 g/dL (ref 6.0–8.5)

## 2016-11-11 LAB — URINALYSIS
BILIRUBIN UA: NEGATIVE
Glucose, UA: NEGATIVE
Ketones, UA: NEGATIVE
LEUKOCYTES UA: NEGATIVE
NITRITE UA: NEGATIVE
PH UA: 5.5 (ref 5.0–7.5)
Protein, UA: NEGATIVE
RBC UA: NEGATIVE
Urobilinogen, Ur: 0.2 mg/dL (ref 0.2–1.0)

## 2016-11-11 LAB — CBC WITH DIFFERENTIAL/PLATELET
BASOS ABS: 0 10*3/uL (ref 0.0–0.2)
Basos: 1 %
EOS (ABSOLUTE): 0.1 10*3/uL (ref 0.0–0.4)
EOS: 2 %
HEMOGLOBIN: 11.3 g/dL (ref 11.1–15.9)
Hematocrit: 34.9 % (ref 34.0–46.6)
IMMATURE GRANS (ABS): 0 10*3/uL (ref 0.0–0.1)
Immature Granulocytes: 0 %
LYMPHS: 20 %
Lymphocytes Absolute: 1.3 10*3/uL (ref 0.7–3.1)
MCH: 31 pg (ref 26.6–33.0)
MCHC: 32.4 g/dL (ref 31.5–35.7)
MCV: 96 fL (ref 79–97)
MONOCYTES: 8 %
Monocytes Absolute: 0.5 10*3/uL (ref 0.1–0.9)
Neutrophils Absolute: 4.6 10*3/uL (ref 1.4–7.0)
Neutrophils: 69 %
Platelets: 361 10*3/uL (ref 150–379)
RBC: 3.64 x10E6/uL — AB (ref 3.77–5.28)
RDW: 14 % (ref 12.3–15.4)
WBC: 6.6 10*3/uL (ref 3.4–10.8)

## 2016-11-11 LAB — URIC ACID: URIC ACID: 5.8 mg/dL (ref 2.5–7.1)

## 2016-11-11 LAB — LIPID PANEL
Chol/HDL Ratio: 3.4 ratio (ref 0.0–4.4)
Cholesterol, Total: 179 mg/dL (ref 100–199)
HDL: 53 mg/dL (ref >39)
LDL Calculated: 106 mg/dL — ABNORMAL HIGH (ref 0–99)
Triglycerides: 99 mg/dL (ref 0–149)
VLDL Cholesterol Cal: 20 mg/dL (ref 5–40)

## 2016-11-11 LAB — VITAMIN D 25 HYDROXY (VIT D DEFICIENCY, FRACTURES): Vit D, 25-Hydroxy: 48.3 ng/mL (ref 30.0–100.0)

## 2016-11-11 MED ORDER — RALOXIFENE HCL 60 MG PO TABS: 60.0000 mg | ORAL_TABLET | Freq: Every day | ORAL | 2 refills | Status: DC

## 2016-11-12 ENCOUNTER — Telehealth: Payer: Self-pay

## 2016-11-12 NOTE — Telephone Encounter (Signed)
I am getting a prior auth for Risedronate Sodium and I do not see this on her med list?   Should I do prior auth?

## 2016-11-13 NOTE — Telephone Encounter (Signed)
I changed the med to evista. Thanks though. WS

## 2016-11-25 ENCOUNTER — Encounter: Payer: Self-pay | Admitting: Family Medicine

## 2016-11-25 ENCOUNTER — Ambulatory Visit (INDEPENDENT_AMBULATORY_CARE_PROVIDER_SITE_OTHER): Payer: Medicare Other | Admitting: Family Medicine

## 2016-11-25 VITALS — BP 138/73 | HR 94 | Temp 98.5°F | Ht 61.25 in | Wt 112.0 lb

## 2016-11-25 DIAGNOSIS — R399 Unspecified symptoms and signs involving the genitourinary system: Secondary | ICD-10-CM | POA: Diagnosis not present

## 2016-11-25 LAB — URINALYSIS, COMPLETE
Bilirubin, UA: NEGATIVE
GLUCOSE, UA: NEGATIVE
KETONES UA: NEGATIVE
NITRITE UA: NEGATIVE
Specific Gravity, UA: 1.01 (ref 1.005–1.030)
UUROB: 0.2 mg/dL (ref 0.2–1.0)
pH, UA: 5.5 (ref 5.0–7.5)

## 2016-11-25 LAB — MICROSCOPIC EXAMINATION
RBC, UA: >30 /hpf — AB (ref 0–?)
Renal Epithel, UA: NONE SEEN /hpf

## 2016-11-25 MED ORDER — SULFAMETHOXAZOLE-TRIMETHOPRIM 800-160 MG PO TABS: 1.0000 | ORAL_TABLET | Freq: Two times a day (BID) | ORAL | 0 refills | Status: DC

## 2016-11-25 NOTE — Progress Notes (Signed)
BP 138/73   Pulse 94   Temp 98.5 F (36.9 C) (Oral)   Ht 5' 1.25" (1.556 m)   Wt 112 lb (50.8 kg)   BMI 20.99 kg/m    Subjective:    Patient ID: Tamara Gross, female    DOB: March 01, 1941, 76 y.o.   MRN: [id number]  HPI: Tamara Gross is a 76 y.o. female presenting on 11/25/2016 for Urinary Tract Infection (pt here today c/o frequent urination and dysuriax 3 days)   HPI Dysuria and frequency Patient has been having dysuria and frequency that's been going on for the past 2-3 days. She says that she has not had a urinary tract infection in quite some time. Feels like the ones she's had before. She denies any hematuria or flank pain or fevers or chills. She does have some lower abdominal pressure and pain that is mild but is worsening over the past couple days. She has been trying to hydrate and drink cranberry juice to help with this. It does not seem to be helping yet.  Relevant past medical, surgical, family and social history reviewed and updated as indicated. Interim medical history since our last visit reviewed. Allergies and medications reviewed and updated.  Review of Systems  Constitutional: Negative for chills and fever.  Eyes: Negative for visual disturbance.  Respiratory: Negative for chest tightness and shortness of breath.   Cardiovascular: Negative for chest pain and leg swelling.  Gastrointestinal: Positive for abdominal pain. Negative for constipation, diarrhea, nausea and vomiting.  Genitourinary: Positive for dysuria and frequency. Negative for difficulty urinating, hematuria, pelvic pain, vaginal bleeding, vaginal discharge and vaginal pain.  Musculoskeletal: Negative for back pain and gait problem.  Skin: Negative for rash.  Neurological: Negative for light-headedness and headaches.  Psychiatric/Behavioral: Negative for agitation and behavioral problems.  All other systems reviewed and are negative.   Per HPI unless specifically indicated above          Objective:    BP 138/73   Pulse 94   Temp 98.5 F (36.9 C) (Oral)   Ht 5' 1.25" (1.556 m)   Wt 112 lb (50.8 kg)   BMI 20.99 kg/m   Wt Readings from Last 3 Encounters:  11/25/16 112 lb (50.8 kg)  11/10/16 113 lb 2 oz (51.3 kg)  05/13/16 115 lb (52.2 kg)    Physical Exam  Constitutional: She is oriented to person, place, and time. She appears well-developed and well-nourished. No distress.  Eyes: Conjunctivae are normal.  Cardiovascular: Normal rate, regular rhythm, normal heart sounds and intact distal pulses.   No murmur heard. Pulmonary/Chest: Effort normal and breath sounds normal. No respiratory distress. She has no wheezes. She has no rales.  Abdominal: Soft. Bowel sounds are normal. She exhibits no distension. There is no hepatosplenomegaly. There is tenderness in the suprapubic area. There is no rigidity, no rebound, no guarding and no CVA tenderness.  Musculoskeletal: Normal range of motion. She exhibits no edema or tenderness.  Neurological: She is alert and oriented to person, place, and time. Coordination normal.  Skin: Skin is warm and dry. No rash noted. She is not diaphoretic.  Psychiatric: She has a normal mood and affect. Her behavior is normal.  Nursing note and vitals reviewed.  Urinalysis: Greater than 30 WBCs, greater than 30 RBCs, greater than 10 epithelial cells, many bacteria, 3+ blood, 2+ protein, 3+ leukocytes     Assessment & Plan:   Problem List Items Addressed This Visit    None  Visit Diagnoses    UTI symptoms    -  Primary   Relevant Medications   sulfamethoxazole-trimethoprim (BACTRIM DS,SEPTRA DS) 800-160 MG tablet   Other Relevant Orders   Urinalysis, Complete       Follow up plan: Return if symptoms worsen or fail to improve.  Counseling provided for all of the vaccine components Orders Placed This Encounter  Procedures  . Urinalysis, Complete    Arville CareJoshua Doniesha Landau, MD [facility] 11/25/2016, 9:08  AM

## 2016-11-27 ENCOUNTER — Telehealth: Payer: Self-pay | Admitting: Family Medicine

## 2016-11-27 MED ORDER — RANITIDINE HCL 150 MG PO TABS: 150.0000 mg | ORAL_TABLET | Freq: Two times a day (BID) | ORAL | 0 refills | Status: DC

## 2016-11-27 MED ORDER — ONDANSETRON HCL 4 MG PO TABS: 4.0000 mg | ORAL_TABLET | Freq: Three times a day (TID) | ORAL | 0 refills | Status: DC | PRN

## 2016-11-27 NOTE — Telephone Encounter (Signed)
Pt notified of recommendation Verbalizes understanding 

## 2016-11-27 NOTE — Telephone Encounter (Signed)
Please send her a prescription of Zofran ODT 4 mg every 6 hours when necessary, give her 20, this should help with the nausea and vomiting, take it 30 minutes prior to taking Bactrim, take Bactrim with food. Also please send a prescription of Zantac 150 mg twice a day and give her 20. All antibiotics can cause the same reaction, please have her finish this, she has a lot of other allergies that make medications limited already.

## 2016-11-27 NOTE — Telephone Encounter (Signed)
Per pt Bactrim is causing nausea with vomitting Can RX be changed Please review and advise

## 2017-02-09 ENCOUNTER — Ambulatory Visit (INDEPENDENT_AMBULATORY_CARE_PROVIDER_SITE_OTHER): Payer: Medicare Other

## 2017-02-09 DIAGNOSIS — Z23 Encounter for immunization: Secondary | ICD-10-CM | POA: Diagnosis not present

## 2017-05-13 ENCOUNTER — Ambulatory Visit (INDEPENDENT_AMBULATORY_CARE_PROVIDER_SITE_OTHER): Payer: Medicare Other | Admitting: Family Medicine

## 2017-05-13 ENCOUNTER — Encounter: Payer: Self-pay | Admitting: Family Medicine

## 2017-05-13 VITALS — BP 123/73 | HR 91 | Temp 98.5°F | Ht 61.25 in | Wt 116.2 lb

## 2017-05-13 DIAGNOSIS — M810 Age-related osteoporosis without current pathological fracture: Secondary | ICD-10-CM | POA: Diagnosis not present

## 2017-05-13 DIAGNOSIS — I10 Essential (primary) hypertension: Secondary | ICD-10-CM

## 2017-05-13 DIAGNOSIS — N183 Chronic kidney disease, stage 3 unspecified: Secondary | ICD-10-CM

## 2017-05-13 DIAGNOSIS — D638 Anemia in other chronic diseases classified elsewhere: Secondary | ICD-10-CM | POA: Diagnosis not present

## 2017-05-13 DIAGNOSIS — M1000 Idiopathic gout, unspecified site: Secondary | ICD-10-CM | POA: Diagnosis not present

## 2017-05-13 MED ORDER — RALOXIFENE HCL 60 MG PO TABS: 60.0000 mg | ORAL_TABLET | Freq: Every day | ORAL | 1 refills | Status: DC

## 2017-05-13 MED ORDER — ALLOPURINOL 100 MG PO TABS: 100.0000 mg | ORAL_TABLET | Freq: Every day | ORAL | 3 refills | Status: DC

## 2017-05-13 NOTE — Patient Instructions (Signed)
Calcium 1200 mg daily Vitamin D 2000 units daily 

## 2017-05-13 NOTE — Progress Notes (Signed)
Subjective:  Patient ID: Tamara Gross, female    DOB: December 11, 1940  Age: 77 y.o. MRN: [id number]  CC: Hypertension (pt here today for routine follow up of her chronic medical conditions, no other concerns voiced.)   HPI [name] presents for  follow-up of hypertension. Patient has no history of headache chest pain or shortness of breath or recent cough. Patient also denies symptoms of TIA such as focal numbness or weakness. Patient checks  blood pressure at home. Readings recently very good. Patient denies side effects from medication.  She does swell a little bit in the ankles occasionally but that is not bothersome to her.  States taking it regularly. Allopurinol seems to be working well for gout she has had no attacks recently.  Patient relates that the Evista caused leg pain so she discontinued it.  She is willing to try it again since the Actonel is so expensive and the GI side effects of Fosamax are so prominent.  She says her previous provider recommended she not use the injectable Prolia.  Reason is unknown today.  History [name] has a past medical history of Gout, Hyperlipidemia, Hypertension, and Osteoporosis.   She has a past surgical history that includes Tonsillectomy.   Her family history includes Deep vein thrombosis in her father.She reports that  has never smoked. she has never used smokeless tobacco. Her alcohol and drug histories are not on file.  Current Outpatient Medications on File Prior to Visit  Medication Sig Dispense Refill  . Cholecalciferol (VITAMIN D) 2000 UNITS CAPS Take 1 capsule by mouth.    Marland Kitchen lisinopril (PRINIVIL,ZESTRIL) 20 MG tablet Take 1 tablet (20 mg total) by mouth daily. 90 tablet 3  . Multiple Vitamin (MULTIVITAMIN) tablet Take 1 tablet by mouth daily.    Marland Kitchen NIFEdipine (PROCARDIA XL/ADALAT-CC) 60 MG 24 hr tablet Take 1 tablet (60 mg total) by mouth daily. 90 tablet 3   No current facility-administered medications on file prior to visit.      ROS Review of Systems  Constitutional: Negative for activity change, appetite change and fever.  HENT: Negative for congestion, rhinorrhea and sore throat.   Eyes: Negative for visual disturbance.  Respiratory: Negative for cough and shortness of breath.   Cardiovascular: Negative for chest pain and palpitations.  Gastrointestinal: Negative for abdominal pain, diarrhea and nausea.  Genitourinary: Negative for dysuria.  Musculoskeletal: Negative for arthralgias and myalgias.    Objective:  BP 123/73   Pulse 91   Temp 98.5 F (36.9 C) (Oral)   Ht 5' 1.25" (1.556 m)   Wt 116 lb 4 oz (52.7 kg)   BMI 21.79 kg/m   BP Readings from Last 3 Encounters:  05/13/17 123/73  11/25/16 138/73  11/10/16 128/82    Wt Readings from Last 3 Encounters:  05/13/17 116 lb 4 oz (52.7 kg)  11/25/16 112 lb (50.8 kg)  11/10/16 113 lb 2 oz (51.3 kg)     Physical Exam  Constitutional: She is oriented to person, place, and time. She appears well-developed and well-nourished. No distress.  HENT:  Head: Normocephalic and atraumatic.  Right Ear: External ear normal.  Left Ear: External ear normal.  Nose: Nose normal.  Mouth/Throat: Oropharynx is clear and moist.  Eyes: Conjunctivae and EOM are normal. Pupils are equal, round, and reactive to light.  Neck: Normal range of motion. Neck supple. No thyromegaly present.  Cardiovascular: Normal rate, regular rhythm and normal heart sounds.  No murmur heard. Pulmonary/Chest: Effort normal and breath  sounds normal. No respiratory distress. She has no wheezes. She has no rales.  Abdominal: Soft. Bowel sounds are normal. She exhibits no distension. There is no tenderness.  Lymphadenopathy:    She has no cervical adenopathy.  Neurological: She is alert and oriented to person, place, and time. She has normal reflexes.  Skin: Skin is warm and dry.  Psychiatric: She has a normal mood and affect. Her behavior is normal. Judgment and thought content normal.       Assessment & Plan:   Kashana was seen today for hypertension.  Diagnoses and all orders for this visit:  Essential hypertension -     CBC with Differential/Platelet -     CMP14+EGFR  Age-related osteoporosis without current pathological fracture  Stage 3 chronic kidney disease (HCC)  Anemia of chronic disease  Idiopathic gout, unspecified chronicity, unspecified site -     Uric acid  Other orders -     allopurinol (ZYLOPRIM) 100 MG tablet; Take 1 tablet (100 mg total) by mouth daily. -     raloxifene (EVISTA) 60 MG tablet; Take 1 tablet (60 mg total) by mouth daily.   Allergies as of 05/13/2017   No Known Allergies     Medication List        Accurate as of 05/13/17 12:01 PM. Always use your most recent med list.          allopurinol 100 MG tablet Commonly known as:  ZYLOPRIM Take 1 tablet (100 mg total) by mouth daily.   lisinopril 20 MG tablet Commonly known as:  PRINIVIL,ZESTRIL Take 1 tablet (20 mg total) by mouth daily.   multivitamin tablet Take 1 tablet by mouth daily.   NIFEdipine 60 MG 24 hr tablet Commonly known as:  PROCARDIA XL/ADALAT-CC Take 1 tablet (60 mg total) by mouth daily.   raloxifene 60 MG tablet Commonly known as:  EVISTA Take 1 tablet (60 mg total) by mouth daily.   Vitamin D 2000 units Caps Take 1 capsule by mouth.       Meds ordered this encounter  Medications  . allopurinol (ZYLOPRIM) 100 MG tablet    Sig: Take 1 tablet (100 mg total) by mouth daily.    Dispense:  90 tablet    Refill:  3  . raloxifene (EVISTA) 60 MG tablet    Sig: Take 1 tablet (60 mg total) by mouth daily.    Dispense:  90 tablet    Refill:  1    Will ask nursing point person to check on the availability of Prolia through [employer].  Follow-up: Return in about 6 months (around 11/10/2017).  Claretta Fraise, M.D.

## 2017-05-14 LAB — CBC WITH DIFFERENTIAL/PLATELET
BASOS: 1 %
Basophils Absolute: 0 10*3/uL (ref 0.0–0.2)
EOS (ABSOLUTE): 0.2 10*3/uL (ref 0.0–0.4)
Eos: 3 %
HEMATOCRIT: 34.8 % (ref 34.0–46.6)
HEMOGLOBIN: 11.1 g/dL (ref 11.1–15.9)
IMMATURE GRANS (ABS): 0 10*3/uL (ref 0.0–0.1)
Immature Granulocytes: 0 %
LYMPHS: 23 %
Lymphocytes Absolute: 1.5 10*3/uL (ref 0.7–3.1)
MCH: 31.4 pg (ref 26.6–33.0)
MCHC: 31.9 g/dL (ref 31.5–35.7)
MCV: 98 fL — AB (ref 79–97)
MONOCYTES: 7 %
Monocytes Absolute: 0.5 10*3/uL (ref 0.1–0.9)
NEUTROS ABS: 4.3 10*3/uL (ref 1.4–7.0)
Neutrophils: 66 %
Platelets: 330 10*3/uL (ref 150–379)
RBC: 3.54 x10E6/uL — ABNORMAL LOW (ref 3.77–5.28)
RDW: 13.7 % (ref 12.3–15.4)
WBC: 6.6 10*3/uL (ref 3.4–10.8)

## 2017-05-14 LAB — CMP14+EGFR
A/G RATIO: 1.4 (ref 1.2–2.2)
ALBUMIN: 4.2 g/dL (ref 3.5–4.8)
ALT: 11 IU/L (ref 0–32)
AST: 14 IU/L (ref 0–40)
Alkaline Phosphatase: 75 IU/L (ref 39–117)
BILIRUBIN TOTAL: 0.3 mg/dL (ref 0.0–1.2)
BUN / CREAT RATIO: 18 (ref 12–28)
BUN: 27 mg/dL (ref 8–27)
CHLORIDE: 105 mmol/L (ref 96–106)
CO2: 21 mmol/L (ref 20–29)
Calcium: 9.8 mg/dL (ref 8.7–10.3)
Creatinine, Ser: 1.53 mg/dL — ABNORMAL HIGH (ref 0.57–1.00)
GFR calc Af Amer: 38 mL/min/{1.73_m2} — ABNORMAL LOW (ref >59)
GFR calc non Af Amer: 33 mL/min/{1.73_m2} — ABNORMAL LOW (ref >59)
GLUCOSE: 97 mg/dL (ref 65–99)
Globulin, Total: 3 g/dL (ref 1.5–4.5)
Potassium: 4.9 mmol/L (ref 3.5–5.2)
Sodium: 143 mmol/L (ref 134–144)
TOTAL PROTEIN: 7.2 g/dL (ref 6.0–8.5)

## 2017-05-14 LAB — URIC ACID: Uric Acid: 6.3 mg/dL (ref 2.5–7.1)

## 2017-10-21 ENCOUNTER — Other Ambulatory Visit: Payer: Self-pay | Admitting: Family Medicine

## 2017-11-10 ENCOUNTER — Encounter: Payer: Self-pay | Admitting: Family Medicine

## 2017-11-10 ENCOUNTER — Ambulatory Visit (INDEPENDENT_AMBULATORY_CARE_PROVIDER_SITE_OTHER): Payer: Medicare Other | Admitting: Family Medicine

## 2017-11-10 VITALS — BP 128/76 | HR 79 | Temp 98.4°F | Ht 61.25 in | Wt 114.2 lb

## 2017-11-10 DIAGNOSIS — M1000 Idiopathic gout, unspecified site: Secondary | ICD-10-CM

## 2017-11-10 DIAGNOSIS — E782 Mixed hyperlipidemia: Secondary | ICD-10-CM | POA: Diagnosis not present

## 2017-11-10 DIAGNOSIS — I10 Essential (primary) hypertension: Secondary | ICD-10-CM

## 2017-11-10 MED ORDER — LISINOPRIL 20 MG PO TABS: 20.0000 mg | ORAL_TABLET | Freq: Every day | ORAL | 3 refills | Status: DC

## 2017-11-10 MED ORDER — NIFEDIPINE ER OSMOTIC RELEASE 60 MG PO TB24: 60.0000 mg | ORAL_TABLET | Freq: Every day | ORAL | 1 refills | Status: DC

## 2017-11-10 MED ORDER — ALENDRONATE SODIUM 35 MG PO TABS: 35.0000 mg | ORAL_TABLET | ORAL | 3 refills | Status: DC

## 2017-11-10 NOTE — Patient Instructions (Signed)
Take Calcium gluconate to prevent calcium induced constipation.

## 2017-11-11 LAB — LIPID PANEL
CHOLESTEROL TOTAL: 159 mg/dL (ref 100–199)
Chol/HDL Ratio: 3.6 ratio (ref 0.0–4.4)
HDL: 44 mg/dL (ref >39)
LDL CALC: 93 mg/dL (ref 0–99)
TRIGLYCERIDES: 110 mg/dL (ref 0–149)
VLDL CHOLESTEROL CAL: 22 mg/dL (ref 5–40)

## 2017-11-11 LAB — CMP14+EGFR
ALT: 8 IU/L (ref 0–32)
AST: 11 IU/L (ref 0–40)
Albumin/Globulin Ratio: 1.4 (ref 1.2–2.2)
Albumin: 4 g/dL (ref 3.5–4.8)
Alkaline Phosphatase: 65 IU/L (ref 39–117)
BILIRUBIN TOTAL: 0.3 mg/dL (ref 0.0–1.2)
BUN/Creatinine Ratio: 13 (ref 12–28)
BUN: 22 mg/dL (ref 8–27)
CHLORIDE: 107 mmol/L — AB (ref 96–106)
CO2: 21 mmol/L (ref 20–29)
Calcium: 9.7 mg/dL (ref 8.7–10.3)
Creatinine, Ser: 1.71 mg/dL — ABNORMAL HIGH (ref 0.57–1.00)
GFR calc non Af Amer: 28 mL/min/{1.73_m2} — ABNORMAL LOW (ref >59)
GFR, EST AFRICAN AMERICAN: 33 mL/min/{1.73_m2} — AB (ref >59)
GLUCOSE: 89 mg/dL (ref 65–99)
Globulin, Total: 2.8 g/dL (ref 1.5–4.5)
Potassium: 4.7 mmol/L (ref 3.5–5.2)
Sodium: 141 mmol/L (ref 134–144)
TOTAL PROTEIN: 6.8 g/dL (ref 6.0–8.5)

## 2017-11-11 LAB — CBC WITH DIFFERENTIAL/PLATELET
BASOS ABS: 0.1 10*3/uL (ref 0.0–0.2)
Basos: 1 %
EOS (ABSOLUTE): 0.2 10*3/uL (ref 0.0–0.4)
EOS: 3 %
Hematocrit: 33.3 % — ABNORMAL LOW (ref 34.0–46.6)
Hemoglobin: 10.7 g/dL — ABNORMAL LOW (ref 11.1–15.9)
IMMATURE GRANS (ABS): 0 10*3/uL (ref 0.0–0.1)
IMMATURE GRANULOCYTES: 0 %
Lymphocytes Absolute: 1.4 10*3/uL (ref 0.7–3.1)
Lymphs: 18 %
MCH: 31.8 pg (ref 26.6–33.0)
MCHC: 32.1 g/dL (ref 31.5–35.7)
MCV: 99 fL — ABNORMAL HIGH (ref 79–97)
Monocytes Absolute: 0.6 10*3/uL (ref 0.1–0.9)
Monocytes: 8 %
NEUTROS ABS: 5.2 10*3/uL (ref 1.4–7.0)
Neutrophils: 70 %
PLATELETS: 323 10*3/uL (ref 150–450)
RBC: 3.37 x10E6/uL — ABNORMAL LOW (ref 3.77–5.28)
RDW: 12.8 % (ref 12.3–15.4)
WBC: 7.4 10*3/uL (ref 3.4–10.8)

## 2017-11-11 LAB — URIC ACID: Uric Acid: 6.3 mg/dL (ref 2.5–7.1)

## 2017-11-18 ENCOUNTER — Encounter: Payer: Self-pay | Admitting: Family Medicine

## 2017-11-18 NOTE — Progress Notes (Signed)
Subjective:  Patient ID: Tamara Gross, female    DOB: 1940/09/24  Age: 77 y.o. MRN: [id number]  CC: Medical Management of Chronic Issues   HPI Tamara Gross presents for  follow-up of hypertension. Patient has no history of headache chest pain or shortness of breath or recent cough. Patient also denies symptoms of TIA such as focal numbness or weakness. Patient denies side effects from medication. States taking it regularly.  Patient is also being followed for elevated cholesterol.  She has declined treatment History Tamara Gross has a past medical history of Gout, Hyperlipidemia, Hypertension, and Osteoporosis.   She has a past surgical history that includes Tonsillectomy.   Her family history includes Deep vein thrombosis in her father.She reports that she has never smoked. She has never used smokeless tobacco. Her alcohol and drug histories are not on file.  Current Outpatient Medications on File Prior to Visit  Medication Sig Dispense Refill  . allopurinol (ZYLOPRIM) 100 MG tablet Take 1 tablet (100 mg total) by mouth daily. 90 tablet 3  . Cholecalciferol (VITAMIN D) 2000 UNITS CAPS Take 1 capsule by mouth.    . Multiple Vitamin (MULTIVITAMIN) tablet Take 1 tablet by mouth daily.     No current facility-administered medications on file prior to visit.     ROS Review of Systems  Constitutional: Negative.   HENT: Negative for congestion.   Eyes: Negative for visual disturbance.  Respiratory: Negative for shortness of breath.   Cardiovascular: Negative for chest pain.  Gastrointestinal: Negative for abdominal pain, constipation, diarrhea, nausea and vomiting.  Genitourinary: Negative for difficulty urinating.  Musculoskeletal: Negative for arthralgias and myalgias.  Neurological: Negative for headaches.  Psychiatric/Behavioral: Negative for sleep disturbance.    Objective:  BP 128/76   Pulse 79   Temp 98.4 F (36.9 C) (Oral)   Ht 5' 1.25" (1.556 m)   Wt 114 lb 4 oz (51.8  kg)   BMI 21.41 kg/m   BP Readings from Last 3 Encounters:  11/10/17 128/76  05/13/17 123/73  11/25/16 138/73    Wt Readings from Last 3 Encounters:  11/10/17 114 lb 4 oz (51.8 kg)  05/13/17 116 lb 4 oz (52.7 kg)  11/25/16 112 lb (50.8 kg)     Physical Exam  Constitutional: She is oriented to person, place, and time. She appears well-developed and well-nourished. No distress.  HENT:  Head: Normocephalic and atraumatic.  Right Ear: External ear normal.  Left Ear: External ear normal.  Nose: Nose normal.  Mouth/Throat: Oropharynx is clear and moist.  Eyes: Pupils are equal, round, and reactive to light. Conjunctivae and EOM are normal.  Neck: Normal range of motion. Neck supple. No thyromegaly present.  Cardiovascular: Normal rate, regular rhythm and normal heart sounds.  No murmur heard. Pulmonary/Chest: Effort normal and breath sounds normal. No respiratory distress. She has no wheezes. She has no rales.  Abdominal: Soft. She exhibits no distension. There is no tenderness.  Lymphadenopathy:    She has no cervical adenopathy.  Neurological: She is alert and oriented to person, place, and time. She has normal reflexes.  Skin: Skin is warm and dry.  Psychiatric: She has a normal mood and affect. Her behavior is normal. Judgment and thought content normal.      Assessment & Plan:   Tamara Gross was seen today for medical management of chronic issues.  Diagnoses and all orders for this visit:  Essential hypertension  Mixed hyperlipidemia -     CBC with Differential/Platelet -  CMP14+EGFR -     Lipid panel  Idiopathic gout, unspecified chronicity, unspecified site -     Uric acid  Other orders -     lisinopril (PRINIVIL,ZESTRIL) 20 MG tablet; Take 1 tablet (20 mg total) by mouth daily. -     NIFEdipine (PROCARDIA XL/ADALAT-CC) 60 MG 24 hr tablet; Take 1 tablet (60 mg total) by mouth daily. -     alendronate (FOSAMAX) 35 MG tablet; Take 1 tablet (35 mg total) by mouth  every 7 (seven) days. Take with a full glass of water on an empty stomach.   Allergies as of 11/10/2017   No Known Allergies     Medication List        Accurate as of 11/10/17 11:59 PM. Always use your most recent med list.          alendronate 35 MG tablet Commonly known as:  FOSAMAX Take 1 tablet (35 mg total) by mouth every 7 (seven) days. Take with a full glass of water on an empty stomach.   allopurinol 100 MG tablet Commonly known as:  ZYLOPRIM Take 1 tablet (100 mg total) by mouth daily.   lisinopril 20 MG tablet Commonly known as:  PRINIVIL,ZESTRIL Take 1 tablet (20 mg total) by mouth daily.   multivitamin tablet Take 1 tablet by mouth daily.   NIFEdipine 60 MG 24 hr tablet Commonly known as:  PROCARDIA XL/ADALAT-CC Take 1 tablet (60 mg total) by mouth daily.   Vitamin D 2000 units Caps Take 1 capsule by mouth.       Meds ordered this encounter  Medications  . lisinopril (PRINIVIL,ZESTRIL) 20 MG tablet    Sig: Take 1 tablet (20 mg total) by mouth daily.    Dispense:  90 tablet    Refill:  3  . NIFEdipine (PROCARDIA XL/ADALAT-CC) 60 MG 24 hr tablet    Sig: Take 1 tablet (60 mg total) by mouth daily.    Dispense:  90 tablet    Refill:  1  . alendronate (FOSAMAX) 35 MG tablet    Sig: Take 1 tablet (35 mg total) by mouth every 7 (seven) days. Take with a full glass of water on an empty stomach.    Dispense:  12 tablet    Refill:  3      Follow-up: Return in about 6 months (around 05/13/2018).  Claretta Fraise, M.D.

## 2018-01-28 ENCOUNTER — Ambulatory Visit (INDEPENDENT_AMBULATORY_CARE_PROVIDER_SITE_OTHER): Payer: Medicare Other | Admitting: *Deleted

## 2018-01-28 DIAGNOSIS — Z23 Encounter for immunization: Secondary | ICD-10-CM | POA: Diagnosis not present

## 2018-05-12 ENCOUNTER — Ambulatory Visit (INDEPENDENT_AMBULATORY_CARE_PROVIDER_SITE_OTHER): Payer: Medicare Other | Admitting: Family Medicine

## 2018-05-12 ENCOUNTER — Encounter: Payer: Self-pay | Admitting: Family Medicine

## 2018-05-12 VITALS — BP 119/74 | HR 89 | Temp 97.3°F | Ht 61.25 in | Wt 115.1 lb

## 2018-05-12 DIAGNOSIS — D638 Anemia in other chronic diseases classified elsewhere: Secondary | ICD-10-CM | POA: Diagnosis not present

## 2018-05-12 DIAGNOSIS — I10 Essential (primary) hypertension: Secondary | ICD-10-CM

## 2018-05-12 DIAGNOSIS — M1000 Idiopathic gout, unspecified site: Secondary | ICD-10-CM | POA: Diagnosis not present

## 2018-05-12 DIAGNOSIS — N183 Chronic kidney disease, stage 3 unspecified: Secondary | ICD-10-CM

## 2018-05-12 DIAGNOSIS — E559 Vitamin D deficiency, unspecified: Secondary | ICD-10-CM | POA: Diagnosis not present

## 2018-05-12 DIAGNOSIS — G510 Bell's palsy: Secondary | ICD-10-CM | POA: Diagnosis not present

## 2018-05-12 MED ORDER — ALLOPURINOL 100 MG PO TABS: 100.0000 mg | ORAL_TABLET | Freq: Every day | ORAL | 3 refills | Status: DC

## 2018-05-12 MED ORDER — NIFEDIPINE ER OSMOTIC RELEASE 60 MG PO TB24: 60.0000 mg | ORAL_TABLET | Freq: Every day | ORAL | 1 refills | Status: DC

## 2018-05-12 NOTE — Progress Notes (Signed)
Subjective:  Patient ID: Tamara Gross, female    DOB: 02/20/1941  Age: 78 y.o. MRN: [id number]  CC: Medical Management of Chronic Issues   HPI Tamara Gross presents for  follow-up of hypertension. Patient has no history of headache chest pain or shortness of breath or recent cough. Patient also denies symptoms of TIA such as focal numbness or weakness. Patient denies side effects from medication. States taking them regularly. Also continues with vitamin D.  Using allopurinol for gout suppression. Due for Uric Acid level. Denies gout attack since last evaluation.   History Tamara Gross has a past medical history of Gout, Hyperlipidemia, Hypertension, and Osteoporosis.   Tamara Gross has a past surgical history that includes Tonsillectomy.   Her family history includes Deep vein thrombosis in her father.Tamara Gross reports that Tamara Gross has never smoked. Tamara Gross has never used smokeless tobacco. No history on file for alcohol and drug.  Current Outpatient Medications on File Prior to Visit  Medication Sig Dispense Refill  . alendronate (FOSAMAX) 35 MG tablet Take 1 tablet (35 mg total) by mouth every 7 (seven) days. Take with a full glass of water on an empty stomach. 12 tablet 3  . Cholecalciferol (VITAMIN D) 2000 UNITS CAPS Take 1 capsule by mouth.    Marland Kitchen lisinopril (PRINIVIL,ZESTRIL) 20 MG tablet Take 1 tablet (20 mg total) by mouth daily. 90 tablet 3  . Multiple Vitamin (MULTIVITAMIN) tablet Take 1 tablet by mouth daily.     No current facility-administered medications on file prior to visit.     ROS Review of Systems  Constitutional: Negative.   HENT: Negative for congestion.   Eyes: Negative for visual disturbance.  Respiratory: Negative for shortness of breath.   Cardiovascular: Negative for chest pain.  Gastrointestinal: Negative for abdominal pain, constipation, diarrhea, nausea and vomiting.  Genitourinary: Negative for difficulty urinating.  Musculoskeletal: Negative for arthralgias and myalgias.    Neurological: Negative for headaches.  Psychiatric/Behavioral: Negative for sleep disturbance.    Objective:  BP 119/74   Pulse 89   Temp (!) 97.3 F (36.3 C) (Oral)   Ht 5' 1.25" (1.556 m)   Wt 115 lb 2 oz (52.2 kg)   BMI 21.58 kg/m   BP Readings from Last 3 Encounters:  05/12/18 119/74  11/10/17 128/76  05/13/17 123/73    Wt Readings from Last 3 Encounters:  05/12/18 115 lb 2 oz (52.2 kg)  11/10/17 114 lb 4 oz (51.8 kg)  05/13/17 116 lb 4 oz (52.7 kg)     Physical Exam Constitutional:      General: Tamara Gross is not in acute distress.    Appearance: Tamara Gross is well-developed.  HENT:     Head: Normocephalic and atraumatic.     Right Ear: External ear normal.     Left Ear: External ear normal.     Nose: Nose normal.  Eyes:     Conjunctiva/sclera: Conjunctivae normal.     Pupils: Pupils are equal, round, and reactive to light.  Neck:     Musculoskeletal: Normal range of motion and neck supple.     Thyroid: No thyromegaly.  Cardiovascular:     Rate and Rhythm: Normal rate and regular rhythm.     Heart sounds: Normal heart sounds. No murmur.  Pulmonary:     Effort: Pulmonary effort is normal. No respiratory distress.     Breath sounds: Normal breath sounds. No wheezing or rales.  Abdominal:     General: Bowel sounds are normal. There is no distension.  Palpations: Abdomen is soft.     Tenderness: There is no abdominal tenderness.  Lymphadenopathy:     Cervical: No cervical adenopathy.  Skin:    General: Skin is warm and dry.  Neurological:     Mental Status: Tamara Gross is alert and oriented to person, place, and time.     Deep Tendon Reflexes: Reflexes are normal and symmetric.  Psychiatric:        Behavior: Behavior normal.        Thought Content: Thought content normal.        Judgment: Judgment normal.       Assessment & Plan:   Tamara Gross was seen today for medical management of chronic issues.  Diagnoses and all orders for this visit:  Stage 3 chronic kidney  disease ([facility]) -     CBC with Differential/Platelet -     CMP14+EGFR  Essential hypertension  Facial nerve palsy  Anemia of chronic disease  Vitamin D deficiency  Idiopathic gout, unspecified chronicity, unspecified site -     Uric acid  Other orders -     allopurinol (ZYLOPRIM) 100 MG tablet; Take 1 tablet (100 mg total) by mouth daily. -     NIFEdipine (PROCARDIA XL/NIFEDICAL XL) 60 MG 24 hr tablet; Take 1 tablet (60 mg total) by mouth daily.   Allergies as of 05/12/2018   No Known Allergies     Medication List       Accurate as of May 12, 2018  9:18 AM. Always use your most recent med list.        alendronate 35 MG tablet Commonly known as:  FOSAMAX Take 1 tablet (35 mg total) by mouth every 7 (seven) days. Take with a full glass of water on an empty stomach.   allopurinol 100 MG tablet Commonly known as:  ZYLOPRIM Take 1 tablet (100 mg total) by mouth daily.   lisinopril 20 MG tablet Commonly known as:  PRINIVIL,ZESTRIL Take 1 tablet (20 mg total) by mouth daily.   multivitamin tablet Take 1 tablet by mouth daily.   NIFEdipine 60 MG 24 hr tablet Commonly known as:  PROCARDIA XL/NIFEDICAL XL Take 1 tablet (60 mg total) by mouth daily.   Vitamin D 50 MCG (2000 UT) Caps Take 1 capsule by mouth.       Meds ordered this encounter  Medications  . allopurinol (ZYLOPRIM) 100 MG tablet    Sig: Take 1 tablet (100 mg total) by mouth daily.    Dispense:  90 tablet    Refill:  3  . NIFEdipine (PROCARDIA XL/NIFEDICAL XL) 60 MG 24 hr tablet    Sig: Take 1 tablet (60 mg total) by mouth daily.    Dispense:  90 tablet    Refill:  1     Follow-up: Return in about 6 months (around 11/10/2018) for Wellness.  Claretta Fraise, M.D.

## 2018-05-13 LAB — CBC WITH DIFFERENTIAL/PLATELET
BASOS ABS: 0.1 10*3/uL (ref 0.0–0.2)
Basos: 1 %
EOS (ABSOLUTE): 0.2 10*3/uL (ref 0.0–0.4)
Eos: 3 %
Hematocrit: 34.8 % (ref 34.0–46.6)
Hemoglobin: 11.3 g/dL (ref 11.1–15.9)
IMMATURE GRANS (ABS): 0 10*3/uL (ref 0.0–0.1)
Immature Granulocytes: 0 %
LYMPHS ABS: 1.5 10*3/uL (ref 0.7–3.1)
LYMPHS: 23 %
MCH: 31.8 pg (ref 26.6–33.0)
MCHC: 32.5 g/dL (ref 31.5–35.7)
MCV: 98 fL — ABNORMAL HIGH (ref 79–97)
Monocytes Absolute: 0.6 10*3/uL (ref 0.1–0.9)
Monocytes: 10 %
NEUTROS ABS: 4 10*3/uL (ref 1.4–7.0)
Neutrophils: 63 %
Platelets: 343 10*3/uL (ref 150–450)
RBC: 3.55 x10E6/uL — ABNORMAL LOW (ref 3.77–5.28)
RDW: 12.3 % (ref 11.7–15.4)
WBC: 6.4 10*3/uL (ref 3.4–10.8)

## 2018-05-13 LAB — CMP14+EGFR
ALK PHOS: 72 IU/L (ref 39–117)
ALT: 8 IU/L (ref 0–32)
AST: 16 IU/L (ref 0–40)
Albumin/Globulin Ratio: 1.6 (ref 1.2–2.2)
Albumin: 4.3 g/dL (ref 3.7–4.7)
BUN/Creatinine Ratio: 16 (ref 12–28)
BUN: 26 mg/dL (ref 8–27)
Bilirubin Total: 0.3 mg/dL (ref 0.0–1.2)
CALCIUM: 9.9 mg/dL (ref 8.7–10.3)
CO2: 20 mmol/L (ref 20–29)
CREATININE: 1.65 mg/dL — AB (ref 0.57–1.00)
Chloride: 103 mmol/L (ref 96–106)
GFR calc Af Amer: 34 mL/min/{1.73_m2} — ABNORMAL LOW (ref >59)
GFR calc non Af Amer: 30 mL/min/{1.73_m2} — ABNORMAL LOW (ref >59)
GLOBULIN, TOTAL: 2.7 g/dL (ref 1.5–4.5)
GLUCOSE: 89 mg/dL (ref 65–99)
Potassium: 4.7 mmol/L (ref 3.5–5.2)
SODIUM: 141 mmol/L (ref 134–144)
Total Protein: 7 g/dL (ref 6.0–8.5)

## 2018-05-13 LAB — URIC ACID: URIC ACID: 6.1 mg/dL (ref 2.5–7.1)

## 2018-05-16 NOTE — Progress Notes (Signed)
Hello Zyair,  Your lab result is normal.Some minor variations that are not significant are commonly marked abnormal, but do not represent any medical problem for you.  Best regards, Mechele Claude, M.D.

## 2018-06-02 ENCOUNTER — Ambulatory Visit (INDEPENDENT_AMBULATORY_CARE_PROVIDER_SITE_OTHER): Payer: Medicare Other | Admitting: Nurse Practitioner

## 2018-06-02 ENCOUNTER — Encounter: Payer: Self-pay | Admitting: Nurse Practitioner

## 2018-06-02 VITALS — BP 133/78 | HR 95 | Temp 97.2°F | Ht 61.0 in | Wt 116.0 lb

## 2018-06-02 DIAGNOSIS — H6123 Impacted cerumen, bilateral: Secondary | ICD-10-CM | POA: Diagnosis not present

## 2018-06-02 NOTE — Progress Notes (Signed)
   Subjective:    Patient ID: Tamara Gross, female    DOB: [birth year], 78 y.o.   MRN: [id number]   Chief Complaint: ears stopped up and Nasal Congestion   HPI Patient come sin c/o of ears stopped up and some difficulty hearing. Started a couple of days ago.   Review of Systems  Constitutional: Negative for chills and fever.  HENT: Positive for congestion and hearing loss. Negative for ear discharge, ear pain, sinus pressure, sinus pain, sore throat and trouble swallowing.   Respiratory: Negative for cough and shortness of breath.   Cardiovascular: Negative.   Neurological: Negative for headaches.  Psychiatric/Behavioral: Negative.   All other systems reviewed and are negative.      Objective:   Physical Exam Vitals signs and nursing note reviewed.  Constitutional:      General: She is not in acute distress.    Appearance: She is normal weight.  HENT:     Right Ear: External ear normal. A foreign body (cerumen impaction) is present.     Left Ear: External ear normal. A foreign body (cerumen impaction) is present.     Nose: Nose normal.     Mouth/Throat:     Lips: Pink.     Mouth: Mucous membranes are dry.  Neck:     Musculoskeletal: Normal range of motion.  Cardiovascular:     Rate and Rhythm: Normal rate and regular rhythm.     Heart sounds: Normal heart sounds.  Pulmonary:     Effort: Pulmonary effort is normal.     Breath sounds: Normal breath sounds.  Skin:    General: Skin is warm and dry.  Neurological:     General: No focal deficit present.     Mental Status: She is alert and oriented to person, place, and time.  Psychiatric:        Mood and Affect: Mood normal.        Behavior: Behavior normal.    BP 133/78   Pulse 95   Temp (!) 97.2 F (36.2 C) (Oral)   Ht 5\' 1"  (1.549 m)   Wt 116 lb (52.6 kg)   BMI 21.92 kg/m   S/P ear lavage- TM's normal      Assessment & Plan:  Tamara Gross in today with chief complaint of ears stopped up and Nasal  Congestion   1. Bilateral impacted cerumen Continue debrox 1-2 x a week Keep follow up appointments  Tamara Daphine Deutscher, FNP

## 2018-06-02 NOTE — Patient Instructions (Signed)
Earwax Buildup, Adult  The ears produce a substance called earwax that helps keep bacteria out of the ear and protects the skin in the ear canal. Occasionally, earwax can build up in the ear and cause discomfort or hearing loss.  What increases the risk?  This condition is more likely to develop in people who:  · Are female.  · Are elderly.  · Naturally produce more earwax.  · Clean their ears often with cotton swabs.  · Use earplugs often.  · Use in-ear headphones often.  · Wear hearing aids.  · Have narrow ear canals.  · Have earwax that is overly thick or sticky.  · Have eczema.  · Are dehydrated.  · Have excess hair in the ear canal.  What are the signs or symptoms?  Symptoms of this condition include:  · Reduced or muffled hearing.  · A feeling of fullness in the ear or feeling that the ear is plugged.  · Fluid coming from the ear.  · Ear pain.  · Ear itch.  · Ringing in the ear.  · Coughing.  · An obvious piece of earwax that can be seen inside the ear canal.  How is this diagnosed?  This condition may be diagnosed based on:  · Your symptoms.  · Your medical history.  · An ear exam. During the exam, your health care provider will look into your ear with an instrument called an otoscope.  You may have tests, including a hearing test.  How is this treated?  This condition may be treated by:  · Using ear drops to soften the earwax.  · Having the earwax removed by a health care provider. The health care provider may:  ? Flush the ear with water.  ? Use an instrument that has a loop on the end (curette).  ? Use a suction device.  · Surgery to remove the wax buildup. This may be done in severe cases.  Follow these instructions at home:    · Take over-the-counter and prescription medicines only as told by your health care provider.  · Do not put any objects, including cotton swabs, into your ear. You can clean the opening of your ear canal with a washcloth or facial tissue.  · Follow instructions from your health care  provider about cleaning your ears. Do not over-clean your ears.  · Drink enough fluid to keep your urine clear or pale yellow. This will help to thin the earwax.  · Keep all follow-up visits as told by your health care provider. If earwax builds up in your ears often or if you use hearing aids, consider seeing your health care provider for routine, preventive ear cleanings. Ask your health care provider how often you should schedule your cleanings.  · If you have hearing aids, clean them according to instructions from the manufacturer and your health care provider.  Contact a health care provider if:  · You have ear pain.  · You develop a fever.  · You have blood, pus, or other fluid coming from your ear.  · You have hearing loss.  · You have ringing in your ears that does not go away.  · Your symptoms do not improve with treatment.  · You feel like the room is spinning (vertigo).  Summary  · Earwax can build up in the ear and cause discomfort or hearing loss.  · The most common symptoms of this condition include reduced or muffled hearing and a feeling of   fullness in the ear or feeling that the ear is plugged.  · This condition may be diagnosed based on your symptoms, your medical history, and an ear exam.  · This condition may be treated by using ear drops to soften the earwax or by having the earwax removed by a health care provider.  · Do not put any objects, including cotton swabs, into your ear. You can clean the opening of your ear canal with a washcloth or facial tissue.  This information is not intended to replace advice given to you by your health care provider. Make sure you discuss any questions you have with your health care provider.  Document Released: 05/14/2004 Document Revised: 03/18/2017 Document Reviewed: 06/17/2016  [employer] Interactive Patient Education © 2019 [employer]

## 2018-06-10 ENCOUNTER — Encounter: Payer: Self-pay | Admitting: *Deleted

## 2018-06-22 ENCOUNTER — Ambulatory Visit (INDEPENDENT_AMBULATORY_CARE_PROVIDER_SITE_OTHER): Payer: Medicare Other | Admitting: *Deleted

## 2018-06-22 VITALS — BP 133/73 | HR 73 | Ht 61.0 in | Wt 116.2 lb

## 2018-06-22 DIAGNOSIS — Z0000 Encounter for general adult medical examination without abnormal findings: Secondary | ICD-10-CM | POA: Diagnosis not present

## 2018-06-22 NOTE — Patient Instructions (Signed)
Tamara Gross , Thank you for taking time to come for your Medicare Wellness Visit. I appreciate your ongoing commitment to your health goals. Please review the following plan we discussed and let me know if I can assist you in the future.   These are the goals we discussed: Goals    . Have 3 meals a day     Eat 3 meals daily       This is a list of the screening recommended for you and due dates:  Health Maintenance  Topic Date Due  . DEXA scan (bone density measurement)  03/11/2005  . Tetanus Vaccine  05/07/2024  . Flu Shot  Completed  . Pneumonia vaccines  Completed    Fall Prevention in the Home, Adult Falls can cause injuries. They can happen to people of all ages. There are many things you can do to make your home safe and to help prevent falls. Ask for help when making these changes, if needed. What actions can I take to prevent falls? General Instructions  Use good lighting in all rooms. Replace any light bulbs that burn out.  Turn on the lights when you go into a dark area. Use night-lights.  Keep items that you use often in easy-to-reach places. Lower the shelves around your home if necessary.  Set up your furniture so you have a clear path. Avoid moving your furniture around.  Do not have throw rugs and other things on the floor that can make you trip.  Avoid walking on wet floors.  If any of your floors are uneven, fix them.  Add color or contrast paint or tape to clearly mark and help you see: ? Any grab bars or handrails. ? First and last steps of stairways. ? Where the edge of each step is.  If you use a stepladder: ? Make sure that it is fully opened. Do not climb a closed stepladder. ? Make sure that both sides of the stepladder are locked into place. ? Ask someone to hold the stepladder for you while you use it.  If there are any pets around you, be aware of where they are. What can I do in the bathroom?      Keep the floor dry. Clean up any  water that spills onto the floor as soon as it happens.  Remove soap buildup in the tub or shower regularly.  Use non-skid mats or decals on the floor of the tub or shower.  Attach bath mats securely with double-sided, non-slip rug tape.  If you need to sit down in the shower, use a plastic, non-slip stool.  Install grab bars by the toilet and in the tub and shower. Do not use towel bars as grab bars. What can I do in the bedroom?  Make sure that you have a light by your bed that is easy to reach.  Do not use any sheets or blankets that are too big for your bed. They should not hang down onto the floor.  Have a firm chair that has side arms. You can use this for support while you get dressed. What can I do in the kitchen?  Clean up any spills right away.  If you need to reach something above you, use a strong step stool that has a grab bar.  Keep electrical cords out of the way.  Do not use floor polish or wax that makes floors slippery. If you must use wax, use non-skid floor wax. What can  I do with my stairs?  Do not leave any items on the stairs.  Make sure that you have a light switch at the top of the stairs and the bottom of the stairs. If you do not have them, ask someone to add them for you.  Make sure that there are handrails on both sides of the stairs, and use them. Fix handrails that are broken or loose. Make sure that handrails are as long as the stairways.  Install non-slip stair treads on all stairs in your home.  Avoid having throw rugs at the top or bottom of the stairs. If you do have throw rugs, attach them to the floor with carpet tape.  Choose a carpet that does not hide the edge of the steps on the stairway.  Check any carpeting to make sure that it is firmly attached to the stairs. Fix any carpet that is loose or worn. What can I do on the outside of my home?  Use bright outdoor lighting.  Regularly fix the edges of walkways and driveways and fix  any cracks.  Remove anything that might make you trip as you walk through a door, such as a raised step or threshold.  Trim any bushes or trees on the path to your home.  Regularly check to see if handrails are loose or broken. Make sure that both sides of any steps have handrails.  Install guardrails along the edges of any raised decks and porches.  Clear walking paths of anything that might make someone trip, such as tools or rocks.  Have any leaves, snow, or ice cleared regularly.  Use sand or salt on walking paths during winter.  Clean up any spills in your garage right away. This includes grease or oil spills. What other actions can I take?  Wear shoes that: ? Have a low heel. Do not wear high heels. ? Have rubber bottoms. ? Are comfortable and fit you well. ? Are closed at the toe. Do not wear open-toe sandals.  Use tools that help you move around (mobility aids) if they are needed. These include: ? Canes. ? Walkers. ? Scooters. ? Crutches.  Review your medicines with your doctor. Some medicines can make you feel dizzy. This can increase your chance of falling. Ask your doctor what other things you can do to help prevent falls. Where to find more information  Centers for Disease Control and Prevention, [employer]: HealthcareCounselor.com.pt  [employer] on Aging: RingConnections.si Contact a doctor if:  You are afraid of falling at home.  You feel weak, drowsy, or dizzy at home.  You fall at home. Summary  There are many simple things that you can do to make your home safe and to help prevent falls.  Ways to make your home safe include removing tripping hazards and installing grab bars in the bathroom.  Ask for help when making these changes in your home. This information is not intended to replace advice given to you by your health care provider. Make sure you discuss any questions you have with your health care provider. Document Released: 01/31/2009  Document Revised: 11/19/2016 Document Reviewed: 11/19/2016 Elsevier Interactive Patient Education  2019 [employer].   Advance Directive  Advance directives are legal documents that let you make choices ahead of time about your health care and medical treatment in case you become unable to communicate for yourself. Advance directives are a way for you to communicate your wishes to family, friends, and health care providers. This  can help convey your decisions about end-of-life care if you become unable to communicate. Discussing and writing advance directives should happen over time rather than all at once. Advance directives can be changed depending on your situation and what you want, even after you have signed the advance directives. If you do not have an advance directive, some states assign family decision makers to act on your behalf based on how closely you are related to them. Each state has its own laws regarding advance directives. You may want to check with your health care provider, attorney, or state representative about the laws in your state. There are different types of advance directives, such as:  Medical power of attorney.  Living will.  Do not resuscitate (DNR) or do not attempt resuscitation (DNAR) order. Health care proxy and medical power of attorney A health care proxy, also called a health care agent, is a person who is appointed to make medical decisions for you in cases in which you are unable to make the decisions yourself. Generally, people choose someone they know well and trust to represent their preferences. Make sure to ask this person for an agreement to act as your proxy. A proxy may have to exercise judgment in the event of a medical decision for which your wishes are not known. A medical power of attorney is a legal document that names your health care proxy. Depending on the laws in your state, after the document is written, it may also need to  be:  Signed.  Notarized.  Dated.  Copied.  Witnessed.  Incorporated into your medical record. You may also want to appoint someone to manage your financial affairs in a situation in which you are unable to do so. This is called a durable power of attorney for finances. It is a separate legal document from the durable power of attorney for health care. You may choose the same person or someone different from your health care proxy to act as your agent in financial matters. If you do not appoint a proxy, or if there is a concern that the proxy is not acting in your best interests, a court-appointed guardian may be designated to act on your behalf. Living will A living will is a set of instructions documenting your wishes about medical care when you cannot express them yourself. Health care providers should keep a copy of your living will in your medical record. You may want to give a copy to family members or friends. To alert caregivers in case of an emergency, you can place a card in your wallet to let them know that you have a living will and where they can find it. A living will is used if you become:  Terminally ill.  Incapacitated.  Unable to communicate or make decisions. Items to consider in your living will include:  The use or non-use of life-sustaining equipment, such as dialysis machines and breathing machines (ventilators).  A DNR or DNAR order, which is the instruction not to use cardiopulmonary resuscitation (CPR) if breathing or heartbeat stops.  The use or non-use of tube feeding.  Withholding of food and fluids.  Comfort (palliative) care when the goal becomes comfort rather than a cure.  Organ and tissue donation. A living will does not give instructions for distributing your money and property if you should pass away. It is recommended that you seek the advice of a lawyer when writing a will. Decisions about taxes, beneficiaries, and asset distribution will be  legally binding. This process can relieve your family and friends of any concerns surrounding disputes or questions that may come up about the distribution of your assets. DNR or DNAR A DNR or DNAR order is a request not to have CPR in the event that your heart stops beating or you stop breathing. If a DNR or DNAR order has not been made and shared, a health care provider will try to help any patient whose heart has stopped or who has stopped breathing. If you plan to have surgery, talk with your health care provider about how your DNR or DNAR order will be followed if problems occur. Summary  Advance directives are the legal documents that allow you to make choices ahead of time about your health care and medical treatment in case you become unable to communicate for yourself.  The process of discussing and writing advance directives should happen over time. You can change the advance directives, even after you have signed them.  Advance directives include DNR or DNAR orders, living wills, and designating an agent as your medical power of attorney. This information is not intended to replace advice given to you by your health care provider. Make sure you discuss any questions you have with your health care provider. Document Released: 07/14/2007 Document Revised: 02/24/2016 Document Reviewed: 02/24/2016 Elsevier Interactive Patient Education  2019 [employer].

## 2018-06-22 NOTE — Progress Notes (Signed)
Subjective:   Tamara Gross is a 78 y.o. female who presents for an Initial Medicare Annual Wellness Visit. Tamara Gross retired from [employer] in 2010 after 22 years. She lives at home alone but a sister and mother that she visit often.  Tamara Gross enjoys reading and spending time with her family.  She tries to exercise at least 3 days a week for 30 minutes and only eats 1-2 meals daily. She attends church every Sunday and is a Member of the women's group through her church.  Tamara Gross does have stairs she has to climb daily to get to her apartment but has had no falls in the past year. She has had no hospitalizations or surgeries in the past year.  Overall [name] feels that her health is about the same as it was a year ago.      Objective:    Today's Vitals   06/22/18 1015  BP: 133/73  Pulse: 73  Weight: 116 lb 3.2 oz (52.7 kg)  Height: 5\' 1"  (1.549 m)   Body mass index is 21.96 kg/m.  No flowsheet data found.  Current Medications (verified) Outpatient Encounter Medications as of 06/22/2018  Medication Sig  . alendronate (FOSAMAX) 35 MG tablet Take 1 tablet (35 mg total) by mouth every 7 (seven) days. Take with a full glass of water on an empty stomach.  Marland Kitchen allopurinol (ZYLOPRIM) 100 MG tablet Take 1 tablet (100 mg total) by mouth daily.  . Cholecalciferol (VITAMIN D) 2000 UNITS CAPS Take 1 capsule by mouth.  Marland Kitchen lisinopril (PRINIVIL,ZESTRIL) 20 MG tablet Take 1 tablet (20 mg total) by mouth daily.  . Multiple Vitamin (MULTIVITAMIN) tablet Take 1 tablet by mouth daily.  Marland Kitchen NIFEdipine (PROCARDIA XL/NIFEDICAL XL) 60 MG 24 hr tablet Take 1 tablet (60 mg total) by mouth daily.   No facility-administered encounter medications on file as of 06/22/2018.     Allergies (verified) Patient has no known allergies.   History: Past Medical History:  Diagnosis Date  . Gout   . Hyperlipidemia   . Hypertension   . Osteoporosis    Past Surgical History:  Procedure Laterality Date  .  TONSILLECTOMY     Family History  Problem Relation Age of Onset  . Deep vein thrombosis Father    Social History   Socioeconomic History  . Marital status: Widowed    Spouse name: Not on file  . Number of children: 0  . Years of education: college  . Highest education level: Associate degree: academic program  Occupational History  . Occupation: Retired  Engineer, production  . Financial resource strain: Not hard at all  . Food insecurity:    Worry: Never true    Inability: Never true  . Transportation needs:    Medical: No    Non-medical: No  Tobacco Use  . Smoking status: Never Smoker  . Smokeless tobacco: Never Used  Substance and Sexual Activity  . Alcohol use: Never    Frequency: Never  . Drug use: Never  . Sexual activity: Not Currently  Lifestyle  . Physical activity:    Days per week: 0 days    Minutes per session: 0 min  . Stress: Not at all  Relationships  . Social connections:    Talks on phone: Never    Gets together: More than three times a week    Attends religious service: More than 4 times per year    Active member of club or organization: Yes  Attends meetings of clubs or organizations: More than 4 times per year    Relationship status: Widowed  Other Topics Concern  . Not on file  Social History Narrative  . Not on file    Tobacco Counseling Counseling given: Not Answered   Clinical Intake:  Pre-visit preparation completed: Yes  Pain : No/denies pain     Nutritional Status: BMI of 19-24  Normal Nutritional Risks: None Diabetes: No  How often do you need to have someone help you when you read instructions, pamphlets, or other written materials from your doctor or pharmacy?: 1 - Never What is the last grade level you completed in school?: [employer]  Interpreter Needed?: No  Information entered by :: Josetta Huddle, LPN   Activities of Daily Living In your present state of health, do you have any difficulty performing the  following activities: 06/22/2018  Hearing? N  Vision? N  Difficulty concentrating or making decisions? N  Walking or climbing stairs? N  Dressing or bathing? N  Doing errands, shopping? N  Some recent data might be hidden     Immunizations and Health Maintenance Immunization History  Administered Date(s) Administered  . Influenza, High Dose Seasonal PF 02/09/2017, 01/28/2018  . Influenza,inj,Quad PF,6+ Mos 01/31/2013, 01/29/2014, 01/30/2015, 01/20/2016  . Pneumococcal Conjugate-13 11/05/2014  . Pneumococcal Polysaccharide-23 07/29/2011  . Tdap 05/07/2014  . Zoster 07/04/2013   There are no preventive care reminders to display for this patient.  Patient Care Team: Mechele Claude, MD as PCP - General (Family Medicine)  Indicate any recent Medical Services you may have received from other than [facility] providers in the past year (date may be approximate).     Assessment:   This is a routine wellness examination for Tamara Gross.  Hearing/Vision screen No exam data present  Dietary issues and exercise activities discussed: Current Exercise Habits: Home exercise routine, Type of exercise: walking, Time (Minutes): 30, Frequency (Times/Week): 3, Weekly Exercise (Minutes/Week): 90  Goals    . Have 3 meals a day     Eat 3 meals daily      Depression Screen PHQ 2/9 Scores 06/22/2018 06/02/2018 05/12/2018 11/10/2017 05/13/2017 11/25/2016 11/10/2016  PHQ - 2 Score 0 0 0 0 0 0 0    Fall Risk Fall Risk  06/22/2018 06/02/2018 05/12/2018 11/10/2017 05/13/2017  Falls in the past year? 0 0 0 No No  Number falls in past yr: - - - - -  Injury with Fall? - - - - -    Is the patient's home free of loose throw rugs in walkways, pet beds, electrical cords, etc?   yes      Grab bars in the bathroom? yes      Handrails on the stairs?   yes      Adequate lighting?   yes  Timed Get Up and Go Performed   Cognitive Function: MMSE - Mini Mental State Exam 06/22/2018  Orientation to time 5  Orientation to Place 5    Registration 3  Attention/ Calculation 5  Recall 3  Language- name 2 objects 2  Language- repeat 1  Language- follow 3 step command 3  Language- read & follow direction 1  Write a sentence 1  Copy design 1  Total score 30  No Memory loss noted at this time.       Screening Tests Health Maintenance  Topic Date Due  . DEXA SCAN  06/22/2019 (Originally 03/11/2005)  . TETANUS/TDAP  05/07/2024  . INFLUENZA VACCINE  Completed  .  PNA vac Low Risk Adult  Completed  Will schedule Dexa scan   Qualifies for Shingles Vaccine?   Cancer Screenings: Lung: Low Dose CT Chest recommended if Age 61-80 years, 30 pack-year currently smoking OR have quit w/in 15years. Patient does not qualify. Breast: Up to date on Mammogram? Yes   Up to date of Bone Density/Dexa? No Colorectal: up to date  Additional Screenings:  Hepatitis C Screening:      Plan:   Encouraged to try to eat 3 healthy meals daily.  Encouraged to continue to exercise for at least 30 minutes, 3 times weekly.  Instructed on Falls prevention. Explained the importance of Dexa scan and patient declines at this time. Explained the importance of Advance Directive, hand out given and instructed to look over with family. Encouraged to keep all follow up appointments.   I have personally reviewed and noted the following in the patient's chart:   . Medical and social history . Use of alcohol, tobacco or illicit drugs  . Current medications and supplements . Functional ability and status . Nutritional status . Physical activity . Advanced directives . List of other physicians . Hospitalizations, surgeries, and ER visits in previous 12 months . Vitals . Screenings to include cognitive, depression, and falls . Referrals and appointments  In addition, I have reviewed and discussed with patient certain preventive protocols, quality metrics, and best practice recommendations. A written personalized care plan for preventive services as  well as general preventive health recommendations were provided to patient.     Caryl Bis, LPN   08/22/979

## 2018-07-01 ENCOUNTER — Other Ambulatory Visit: Payer: Self-pay

## 2018-07-01 ENCOUNTER — Encounter: Payer: Self-pay | Admitting: Family Medicine

## 2018-07-01 ENCOUNTER — Ambulatory Visit (INDEPENDENT_AMBULATORY_CARE_PROVIDER_SITE_OTHER): Payer: Medicare Other | Admitting: Family Medicine

## 2018-07-01 VITALS — BP 115/68 | HR 86 | Temp 98.3°F | Wt 114.6 lb

## 2018-07-01 DIAGNOSIS — B029 Zoster without complications: Secondary | ICD-10-CM | POA: Diagnosis not present

## 2018-07-01 MED ORDER — VALACYCLOVIR HCL 1 G PO TABS: 1000.0000 mg | ORAL_TABLET | Freq: Two times a day (BID) | ORAL | 0 refills | Status: DC

## 2018-07-01 NOTE — Progress Notes (Signed)
BP 115/68   Pulse 86   Temp 98.3 F (36.8 C) (Oral)   Wt 52 kg   BMI 21.65 kg/m    Subjective:    Patient ID: Tamara Gross, female    DOB: Jun 02, 1940, 78 y.o.   MRN: [id number]  HPI: Tamara Gross is a 78 y.o. female presenting on 07/01/2018 for Rash (pt states she has burn due to falling asleep on heating pad, vesicular rash on L waist area)   HPI Pt is a 78 y/o female presenting for about 5 days of persistent, painful rash on her left lower back and side.  She states she fell asleep with her heating pad, and her skin was red when she woke up, so she thought she had burned herself. She describes the pain as burning, states it hurts more when her clothes touch it, or when she lays on that side. She has tried cortisone cream, aloe gel, and tylenol for the pain, which she feels have given mild relief.  She denies itching, as well as fever and chills.  She states she has had chickenpox, and also had the shingles vaccine a couple of years ago.   Relevant past medical, surgical, family and social history reviewed and updated as indicated. Interim medical history since our last visit reviewed. Allergies and medications reviewed and updated.  Review of Systems  Constitutional: Negative for chills and fever.  HENT: Negative for congestion, rhinorrhea and sore throat.   Eyes: Negative for discharge and redness.  Respiratory: Negative for cough and shortness of breath.   Cardiovascular: Negative for chest pain.  Gastrointestinal: Negative for abdominal pain, diarrhea, nausea and vomiting.  Skin: Positive for rash (painful).    Per HPI unless specifically indicated above        Objective:    BP 115/68   Pulse 86   Temp 98.3 F (36.8 C) (Oral)   Wt 52 kg   BMI 21.65 kg/m   Wt Readings from Last 3 Encounters:  07/01/18 52 kg  06/22/18 52.7 kg  06/02/18 52.6 kg    Physical Exam Constitutional:      General: She is not in acute distress. HENT:     Mouth/Throat:   Pharynx: No oropharyngeal exudate or posterior oropharyngeal erythema.  Eyes:     Pupils: Pupils are equal, round, and reactive to light.  Neck:     Musculoskeletal: Neck supple. No muscular tenderness.  Cardiovascular:     Rate and Rhythm: Normal rate and regular rhythm.     Heart sounds: Normal heart sounds. No murmur.  Pulmonary:     Effort: Pulmonary effort is normal.     Breath sounds: Normal breath sounds. No wheezing.  Lymphadenopathy:     Cervical: No cervical adenopathy.  Skin:    Findings: Erythema and rash present. Rash is vesicular.       Neurological:     Mental Status: She is alert.         Assessment & Plan:   Problem List Items Addressed This Visit    None    Visit Diagnoses    Herpes zoster without complication    -  Primary   Relevant Medications   valACYclovir (VALTREX) 1000 MG tablet    Herpes Zoster - Rash is consistent with Herpes Zoster; prescribed Valtrex to be started as soon as possible - Instructed pt to stop using hydrocortisone cream, but continue to moisturize the rash with aloe vera or moisturizing lotion - Instructed pt to use  good hand hygiene to avoid transmission to others  Follow up plan: Return if symptoms worsen or fail to improve.  Counseling provided for all of the vaccine components No orders of the defined types were placed in this encounter.   Jodelle Gross PA-S [facility]  I was personally present for all components of the history, physical exam and/or medical decision making.  I agree with the documentation performed by the PA student and agree with assessment and plan above.  PA student was Jodelle Gross. Arville Care, MD [facility] 07/07/2018, 12:25 PM

## 2018-10-12 ENCOUNTER — Ambulatory Visit (INDEPENDENT_AMBULATORY_CARE_PROVIDER_SITE_OTHER): Payer: Medicare Other | Admitting: Family Medicine

## 2018-10-12 ENCOUNTER — Other Ambulatory Visit: Payer: Self-pay

## 2018-10-12 ENCOUNTER — Encounter: Payer: Self-pay | Admitting: Family Medicine

## 2018-10-12 DIAGNOSIS — R399 Unspecified symptoms and signs involving the genitourinary system: Secondary | ICD-10-CM

## 2018-10-12 MED ORDER — CEPHALEXIN 500 MG PO CAPS: 500.0000 mg | ORAL_CAPSULE | Freq: Four times a day (QID) | ORAL | 0 refills | Status: DC

## 2018-10-12 NOTE — Progress Notes (Signed)
Virtual Visit via telephone Note  I connected with Tamara Gross on 10/12/18 at 1050 by telephone and verified that I am speaking with the correct person using two identifiers. Tamara Gross is currently located at home and no other people are currently with her during visit. The provider, Elige RadonJoshua A Josua Ferrebee, MD is located in their office at time of visit.  Call ended at [phone]  I discussed the limitations, risks, security and privacy concerns of performing an evaluation and management service by telephone and the availability of in person appointments. I also discussed with the patient that there may be a patient responsible charge related to this service. The patient expressed understanding and agreed to proceed.   History and Present Illness: Patient is having thick yellow urine and dysuria and frequency that has been going on for 4 days. She denies fevers or chills.  Patient has had these before but not frequently.  The last time she was treated with Bactrim but it made her sick to her stomach so she do not do that again.  She has been trying to increase her fluid intake which has helped but she still feels like it is been worsening over the past 4 days.  No diagnosis found.  Outpatient Encounter Medications as of 10/12/2018  Medication Sig  . alendronate (FOSAMAX) 35 MG tablet Take 1 tablet (35 mg total) by mouth every 7 (seven) days. Take with a full glass of water on an empty stomach.  Marland Kitchen. allopurinol (ZYLOPRIM) 100 MG tablet Take 1 tablet (100 mg total) by mouth daily.  . Cholecalciferol (VITAMIN D) 2000 UNITS CAPS Take 1 capsule by mouth.  Marland Kitchen. lisinopril (PRINIVIL,ZESTRIL) 20 MG tablet Take 1 tablet (20 mg total) by mouth daily.  . Multiple Vitamin (MULTIVITAMIN) tablet Take 1 tablet by mouth daily.  Marland Kitchen. NIFEdipine (PROCARDIA XL/NIFEDICAL XL) 60 MG 24 hr tablet Take 1 tablet (60 mg total) by mouth daily.  . valACYclovir (VALTREX) 1000 MG tablet Take 1 tablet (1,000 mg total) by mouth 2  (two) times daily.   No facility-administered encounter medications on file as of 10/12/2018.     Review of Systems  Constitutional: Negative for chills and fever.  Eyes: Negative for visual disturbance.  Respiratory: Negative for chest tightness and shortness of breath.   Cardiovascular: Negative for chest pain and leg swelling.  Gastrointestinal: Negative for abdominal pain.  Genitourinary: Positive for dysuria, frequency and urgency. Negative for difficulty urinating, hematuria, vaginal bleeding, vaginal discharge and vaginal pain.  Musculoskeletal: Negative for back pain and gait problem.  Skin: Negative for rash.  Neurological: Negative for light-headedness and headaches.  Psychiatric/Behavioral: Negative for agitation and behavioral problems.  All other systems reviewed and are negative.   Observations/Objective: Sounds comfortable and in no acute distress  Assessment and Plan: Problem List Items Addressed This Visit    None    Visit Diagnoses    UTI symptoms    -  Primary   Relevant Medications   cephALEXin (KEFLEX) 500 MG capsule       Follow Up Instructions: As needed    I discussed the assessment and treatment plan with the patient. The patient was provided an opportunity to ask questions and all were answered. The patient agreed with the plan and demonstrated an understanding of the instructions.   The patient was advised to call back or seek an in-person evaluation if the symptoms worsen or if the condition fails to improve as anticipated.  The above assessment and management  plan was discussed with the patient. The patient verbalized understanding of and has agreed to the management plan. Patient is aware to call the clinic if symptoms persist or worsen. Patient is aware when to return to the clinic for a follow-up visit. Patient educated on when it is appropriate to go to the emergency department.    I provided 6 minutes of non-face-to-face time during this  encounter.    Worthy Rancher, MD

## 2018-11-10 ENCOUNTER — Other Ambulatory Visit: Payer: Self-pay | Admitting: Family Medicine

## 2018-11-11 ENCOUNTER — Ambulatory Visit: Payer: Medicare Other | Admitting: Family Medicine

## 2018-11-21 ENCOUNTER — Other Ambulatory Visit: Payer: Self-pay

## 2018-11-21 ENCOUNTER — Telehealth: Payer: Self-pay | Admitting: Family Medicine

## 2018-11-22 ENCOUNTER — Encounter: Payer: Self-pay | Admitting: Family Medicine

## 2018-11-22 ENCOUNTER — Ambulatory Visit (INDEPENDENT_AMBULATORY_CARE_PROVIDER_SITE_OTHER): Payer: Medicare Other | Admitting: Family Medicine

## 2018-11-22 NOTE — Progress Notes (Signed)
Pt. Could not be reached in three attempts at the number in her records. Erroneous visit. WS 

## 2018-11-30 ENCOUNTER — Encounter: Payer: Self-pay | Admitting: Family Medicine

## 2018-11-30 ENCOUNTER — Ambulatory Visit (INDEPENDENT_AMBULATORY_CARE_PROVIDER_SITE_OTHER): Payer: Medicare Other | Admitting: Family Medicine

## 2018-11-30 DIAGNOSIS — N183 Chronic kidney disease, stage 3 unspecified: Secondary | ICD-10-CM

## 2018-11-30 DIAGNOSIS — R1314 Dysphagia, pharyngoesophageal phase: Secondary | ICD-10-CM

## 2018-11-30 DIAGNOSIS — I10 Essential (primary) hypertension: Secondary | ICD-10-CM | POA: Diagnosis not present

## 2018-11-30 DIAGNOSIS — M810 Age-related osteoporosis without current pathological fracture: Secondary | ICD-10-CM

## 2018-11-30 DIAGNOSIS — E782 Mixed hyperlipidemia: Secondary | ICD-10-CM | POA: Diagnosis not present

## 2018-11-30 MED ORDER — NIFEDIPINE ER OSMOTIC RELEASE 60 MG PO TB24: 60.0000 mg | ORAL_TABLET | Freq: Every day | ORAL | 1 refills | Status: DC

## 2018-11-30 MED ORDER — RALOXIFENE HCL 60 MG PO TABS: 60.0000 mg | ORAL_TABLET | Freq: Every day | ORAL | 3 refills | Status: DC

## 2018-11-30 MED ORDER — LISINOPRIL 20 MG PO TABS: 20.0000 mg | ORAL_TABLET | Freq: Every day | ORAL | 0 refills | Status: DC

## 2018-11-30 NOTE — Progress Notes (Signed)
Subjective:    Patient ID: Tamara Gross, female    DOB: 05-09-1940, 78 y.o.   MRN: [id number]   HPI: Tamara Gross is a 78 y.o. female presenting for inability to swallow for two days after each dose of fosamax. It made her feel weird also. Willing to go back to Evista since the leg pain resolved when pt changed mattresses. Pt. Only goes to the grocery store. Stays home otherwise due to COVID.  Pt. Recently had shingles. They were painful, but resolved with use of valtrex. Chart review shows she had zostrix in 2015. Has not had shingrix.   [facility] 06/22/2018 06/02/2018 05/12/2018 11/10/2017 05/13/2017  Decreased Interest 0 0 0 0 0  Down, Depressed, Hopeless 0 0 0 0 0  PHQ - 2 Score 0 0 0 0 0   but resolved with use of valtrex.    Relevant past medical, surgical, family and social history reviewed and updated as indicated.  Interim medical history since our last visit reviewed. Allergies and medications reviewed and updated.  ROS:  Review of Systems  Constitutional: Negative.   HENT: Negative for congestion.   Eyes: Negative for visual disturbance.  Respiratory: Negative for shortness of breath.   Cardiovascular: Negative for chest pain.  Gastrointestinal: Negative for abdominal pain, constipation, diarrhea, nausea and vomiting.  Genitourinary: Negative for difficulty urinating.  Musculoskeletal: Negative for arthralgias and myalgias.  Neurological: Negative for headaches.  Psychiatric/Behavioral: Negative for sleep disturbance.     Social History   Tobacco Use  Smoking Status Never Smoker  Smokeless Tobacco Never Used       Objective:     Wt Readings from Last 3 Encounters:  07/01/18 114 lb 9.6 oz (52 kg)  06/22/18 116 lb 3.2 oz (52.7 kg)  06/02/18 116 lb (52.6 kg)     Exam deferred. Pt. Harboring due to COVID 19. Phone visit performed.   Assessment & Plan:   1. Essential hypertension   2. Stage 3 chronic kidney disease (HCC)   3. Mixed  hyperlipidemia   4. Pharyngoesophageal dysphagia   5. Age-related osteoporosis without current pathological fracture     Meds ordered this encounter  Medications  . lisinopril (ZESTRIL) 20 MG tablet    Sig: Take 1 tablet (20 mg total) by mouth daily.    Dispense:  90 tablet    Refill:  0  . NIFEdipine (PROCARDIA XL/NIFEDICAL XL) 60 MG 24 hr tablet    Sig: Take 1 tablet (60 mg total) by mouth daily.    Dispense:  90 tablet    Refill:  1  . raloxifene (EVISTA) 60 MG tablet    Sig: Take 1 tablet (60 mg total) by mouth daily. For bone health    Dispense:  90 tablet    Refill:  3    PT. DCed fosamax, will resume evista    Diagnoses and all orders for this visit:  Essential hypertension  Stage 3 chronic kidney disease (HCC)  Mixed hyperlipidemia  Pharyngoesophageal dysphagia  Age-related osteoporosis without current pathological fracture  Other orders -     lisinopril (ZESTRIL) 20 MG tablet; Take 1 tablet (20 mg total) by mouth daily. -     NIFEdipine (PROCARDIA XL/NIFEDICAL XL) 60 MG 24 hr tablet; Take 1 tablet (60 mg total) by mouth daily. -     raloxifene (EVISTA) 60 MG tablet; Take 1 tablet (60 mg total) by mouth daily. For bone health    Virtual Visit via  telephone Note  I discussed the limitations, risks, security and privacy concerns of performing an evaluation and management service by telephone and the availability of in person appointments. The patient was identified with two identifiers. Pt.expressed understanding and agreed to proceed. Pt. Is at home. Dr. Livia Snellen is in his office.  Follow Up Instructions:   I discussed the assessment and treatment plan with the patient. The patient was provided an opportunity to ask questions and all were answered. The patient agreed with the plan and demonstrated an understanding of the instructions.   The patient was advised to call back or seek an in-person evaluation if the symptoms worsen or if the condition fails to  improve as anticipated.   Total minutes including chart review and phone contact time: 30   Follow up plan: Return in about 6 months (around 06/02/2019) for Wellness, hypertension. Drop in sooner for Shingrix.  Claretta Fraise, MD [place]

## 2019-02-08 ENCOUNTER — Other Ambulatory Visit: Payer: Self-pay

## 2019-02-09 ENCOUNTER — Ambulatory Visit (INDEPENDENT_AMBULATORY_CARE_PROVIDER_SITE_OTHER): Payer: Medicare Other

## 2019-02-09 DIAGNOSIS — Z23 Encounter for immunization: Secondary | ICD-10-CM

## 2019-02-21 ENCOUNTER — Ambulatory Visit (INDEPENDENT_AMBULATORY_CARE_PROVIDER_SITE_OTHER): Payer: Medicare Other | Admitting: Family

## 2019-02-21 ENCOUNTER — Encounter: Payer: Self-pay | Admitting: Family

## 2019-02-21 ENCOUNTER — Other Ambulatory Visit: Payer: Self-pay

## 2019-02-21 DIAGNOSIS — R399 Unspecified symptoms and signs involving the genitourinary system: Secondary | ICD-10-CM

## 2019-02-21 MED ORDER — CEPHALEXIN 500 MG PO CAPS: 500.0000 mg | ORAL_CAPSULE | Freq: Two times a day (BID) | ORAL | 0 refills | Status: DC

## 2019-02-21 NOTE — Progress Notes (Signed)
   Virtual Visit via telephone Note Due to COVID-19 pandemic this visit was conducted virtually. This visit type was conducted due to national recommendations for restrictions regarding the COVID-19 Pandemic (e.g. social distancing, sheltering in place) in an effort to limit this patient's exposure and mitigate transmission in our community. All issues noted in this document were discussed and addressed.  A physical exam was not performed with this format.  I connected with Tamara Gross on 02/21/19 at 9:47 AM  by telephone and verified that I am speaking with the correct person using two identifiers. Tamara Gross is currently located at home  and no one is currently with her  during visit. The provider, Evelina Dun, FNP is located in their office at time of visit.  I discussed the limitations, risks, security and privacy concerns of performing an evaluation and management service by telephone and the availability of in person appointments. I also discussed with the patient that there may be a patient responsible charge related to this service. The patient expressed understanding and agreed to proceed.   History and Present Illness:  Dysuria  The current episode started in the past 7 days. The problem occurs every urination. The problem has been gradually worsening. The quality of the pain is described as burning. The pain is at a severity of 8/10. The pain is moderate. There has been no fever. Associated symptoms include frequency, hesitancy and urgency. Pertinent negatives include no flank pain, hematuria, nausea or vomiting. She has tried increased fluids for the symptoms. The treatment provided mild relief.      Review of Systems  Gastrointestinal: Negative for nausea and vomiting.  Genitourinary: Positive for dysuria, frequency, hesitancy and urgency. Negative for flank pain and hematuria.  All other systems reviewed and are negative.    Observations/Objective: No SOB or distress  noted   Assessment and Plan: 1. UTI symptoms Force fluids AZO over the counter X2 days RTO if symptoms worsen or do not improve  - cephALEXin (KEFLEX) 500 MG capsule; Take 1 capsule (500 mg total) by mouth 2 (two) times daily.  Dispense: 14 capsule; Refill: 0     I discussed the assessment and treatment plan with the patient. The patient was provided an opportunity to ask questions and all were answered. The patient agreed with the plan and demonstrated an understanding of the instructions.   The patient was advised to call back or seek an in-person evaluation if the symptoms worsen or if the condition fails to improve as anticipated.  The above assessment and management plan was discussed with the patient. The patient verbalized understanding of and has agreed to the management plan. Patient is aware to call the clinic if symptoms persist or worsen. Patient is aware when to return to the clinic for a follow-up visit. Patient educated on when it is appropriate to go to the emergency department.   Time call ended:  9:55 AM  I provided 8 minutes of non-face-to-face time during this encounter.    Evelina Dun, FNP

## 2019-03-06 ENCOUNTER — Other Ambulatory Visit: Payer: Self-pay | Admitting: Family

## 2019-03-06 DIAGNOSIS — R399 Unspecified symptoms and signs involving the genitourinary system: Secondary | ICD-10-CM

## 2019-05-01 ENCOUNTER — Other Ambulatory Visit: Payer: Self-pay

## 2019-05-02 ENCOUNTER — Ambulatory Visit (INDEPENDENT_AMBULATORY_CARE_PROVIDER_SITE_OTHER): Payer: Medicare Other | Admitting: Family Medicine

## 2019-05-02 ENCOUNTER — Encounter: Payer: Self-pay | Admitting: Family Medicine

## 2019-05-02 VITALS — BP 134/81 | HR 79 | Temp 98.7°F | Ht 61.0 in | Wt 106.6 lb

## 2019-05-02 DIAGNOSIS — Z780 Asymptomatic menopausal state: Secondary | ICD-10-CM

## 2019-05-02 DIAGNOSIS — M1000 Idiopathic gout, unspecified site: Secondary | ICD-10-CM

## 2019-05-02 DIAGNOSIS — N1832 Chronic kidney disease, stage 3b: Secondary | ICD-10-CM

## 2019-05-02 DIAGNOSIS — I10 Essential (primary) hypertension: Secondary | ICD-10-CM

## 2019-05-02 MED ORDER — NIFEDIPINE ER OSMOTIC RELEASE 60 MG PO TB24: 60.0000 mg | ORAL_TABLET | Freq: Every day | ORAL | 1 refills | Status: DC

## 2019-05-02 MED ORDER — RALOXIFENE HCL 60 MG PO TABS: 60.0000 mg | ORAL_TABLET | Freq: Every day | ORAL | 3 refills | Status: DC

## 2019-05-02 MED ORDER — LISINOPRIL 20 MG PO TABS: 20.0000 mg | ORAL_TABLET | Freq: Every day | ORAL | 0 refills | Status: DC

## 2019-05-02 MED ORDER — ALLOPURINOL 100 MG PO TABS: 100.0000 mg | ORAL_TABLET | Freq: Every day | ORAL | 3 refills | Status: DC

## 2019-05-02 NOTE — Progress Notes (Signed)
Subjective:  Patient ID: Tamara Gross, female    DOB: 01-Jul-1940  Age: 79 y.o. MRN: [id number]  CC: Follow-up   HPI Tamara Gross presents for  follow-up of hypertension. Patient has no history of headache chest pain or shortness of breath or recent cough. Patient also denies symptoms of TIA such as focal numbness or weakness. Patient denies side effects from medication. States taking it regularly.  Pt. Is mourning the loss of her 38 year old mother. She is self- isolating still.   Pt. Is treated for osteopenia with evista. She is tolerating it well. She denies side effects.      History Tamara Gross has a past medical history of Gout, Hyperlipidemia, Hypertension, and Osteoporosis.   She has a past surgical history that includes Tonsillectomy.   Her family history includes Deep vein thrombosis in her father.She reports that she has never smoked. She has never used smokeless tobacco. She reports that she does not drink alcohol or use drugs.  Current Outpatient Medications on File Prior to Visit  Medication Sig Dispense Refill  . Cholecalciferol (VITAMIN D) 2000 UNITS CAPS Take 1 capsule by mouth.    . Multiple Vitamin (MULTIVITAMIN) tablet Take 1 tablet by mouth daily.     No current facility-administered medications on file prior to visit.    ROS Review of Systems  Constitutional: Negative.   HENT: Negative.   Eyes: Negative for visual disturbance.  Respiratory: Negative for shortness of breath.   Cardiovascular: Negative for chest pain.  Gastrointestinal: Negative for abdominal pain.  Musculoskeletal: Negative for arthralgias.    Objective:  BP 134/81   Pulse 79   Temp 98.7 F (37.1 C) (Temporal)   Ht 5\' 1"  (1.549 m)   Wt 106 lb 9.6 oz (48.4 kg)   BMI 20.14 kg/m   BP Readings from Last 3 Encounters:  05/02/19 134/81  07/01/18 115/68  06/22/18 133/73    Wt Readings from Last 3 Encounters:  05/02/19 106 lb 9.6 oz (48.4 kg)  07/01/18 114 lb 9.6 oz (52 kg)    06/22/18 116 lb 3.2 oz (52.7 kg)     Physical Exam Constitutional:      General: She is not in acute distress.    Appearance: She is well-developed.  HENT:     Head: Normocephalic and atraumatic.  Eyes:     Conjunctiva/sclera: Conjunctivae normal.     Pupils: Pupils are equal, round, and reactive to light.  Neck:     Thyroid: No thyromegaly.  Cardiovascular:     Rate and Rhythm: Normal rate and regular rhythm.     Heart sounds: Normal heart sounds. No murmur.  Pulmonary:     Effort: Pulmonary effort is normal. No respiratory distress.     Breath sounds: Normal breath sounds. No wheezing or rales.  Abdominal:     General: Bowel sounds are normal. There is no distension.     Palpations: Abdomen is soft.     Tenderness: There is no abdominal tenderness.  Musculoskeletal:        General: Normal range of motion.     Cervical back: Normal range of motion and neck supple.  Lymphadenopathy:     Cervical: No cervical adenopathy.  Skin:    General: Skin is warm and dry.  Neurological:     Mental Status: She is alert and oriented to person, place, and time.  Psychiatric:        Behavior: Behavior normal.  Thought Content: Thought content normal.        Judgment: Judgment normal.       Assessment & Plan:   Tamara Gross was seen today for follow-up.  Diagnoses and all orders for this visit:  Essential hypertension -     CBC -     CMP -     Lipid -     lisinopril (ZESTRIL) 20 MG tablet; Take 1 tablet (20 mg total) by mouth daily. -     NIFEdipine (PROCARDIA XL/NIFEDICAL XL) 60 MG 24 hr tablet; Take 1 tablet (60 mg total) by mouth daily.  Stage 3b chronic kidney disease -     CBC -     CMP -     Lipid  Idiopathic gout, unspecified chronicity, unspecified site -     Uric acid -     allopurinol (ZYLOPRIM) 100 MG tablet; Take 1 tablet (100 mg total) by mouth daily.  Postmenopausal -     raloxifene (EVISTA) 60 MG tablet; Take 1 tablet (60 mg total) by mouth daily. For  bone health   Allergies as of 05/02/2019   No Known Allergies     Medication List       Accurate as of May 02, 2019 11:59 PM. If you have any questions, ask your nurse or doctor.        STOP taking these medications   cephALEXin 500 MG capsule Commonly known as: KEFLEX Stopped by: Mechele Claude, MD     TAKE these medications   allopurinol 100 MG tablet Commonly known as: ZYLOPRIM Take 1 tablet (100 mg total) by mouth daily.   lisinopril 20 MG tablet Commonly known as: ZESTRIL Take 1 tablet (20 mg total) by mouth daily.   multivitamin tablet Take 1 tablet by mouth daily.   NIFEdipine 60 MG 24 hr tablet Commonly known as: PROCARDIA XL/NIFEDICAL XL Take 1 tablet (60 mg total) by mouth daily.   raloxifene 60 MG tablet Commonly known as: Evista Take 1 tablet (60 mg total) by mouth daily. For bone health   Vitamin D 50 MCG (2000 UT) Caps Take 1 capsule by mouth.       Meds ordered this encounter  Medications  . allopurinol (ZYLOPRIM) 100 MG tablet    Sig: Take 1 tablet (100 mg total) by mouth daily.    Dispense:  90 tablet    Refill:  3  . lisinopril (ZESTRIL) 20 MG tablet    Sig: Take 1 tablet (20 mg total) by mouth daily.    Dispense:  90 tablet    Refill:  0  . NIFEdipine (PROCARDIA XL/NIFEDICAL XL) 60 MG 24 hr tablet    Sig: Take 1 tablet (60 mg total) by mouth daily.    Dispense:  90 tablet    Refill:  1  . raloxifene (EVISTA) 60 MG tablet    Sig: Take 1 tablet (60 mg total) by mouth daily. For bone health    Dispense:  90 tablet    Refill:  3    Each of the patient's diagnoses seems to be well-controlled with her current regimen.  She has not had any gout attacks from using allopurinol.  No signs of kidney or liver involvement with the disease.  Of note is that there is some risk to her kidney from the use of allopurinol therefore the dose is being kept low.  We are keeping an eye on the kidney and liver functions as well as the uric acid is  result.  Additionally the patient is on dual therapy for hypertension.  Blood pressure checked at home is stable as well as that in the office.  The patient has had low bone density on previous DEXA scans.  She was started on at this point and monitored periodically with repeat bone density.  She is tolerating the medicine well.  That will be continued as is.  Follow-up: Return in about 6 months (around 10/30/2019).  Mechele Claude, M.D.

## 2019-05-03 LAB — CBC WITH DIFFERENTIAL/PLATELET
Basophils Absolute: 0 10*3/uL (ref 0.0–0.2)
Basos: 1 %
EOS (ABSOLUTE): 0.2 10*3/uL (ref 0.0–0.4)
Eos: 3 %
Hematocrit: 33.1 % — ABNORMAL LOW (ref 34.0–46.6)
Hemoglobin: 10.8 g/dL — ABNORMAL LOW (ref 11.1–15.9)
Immature Grans (Abs): 0 10*3/uL (ref 0.0–0.1)
Immature Granulocytes: 0 %
Lymphocytes Absolute: 1.5 10*3/uL (ref 0.7–3.1)
Lymphs: 20 %
MCH: 32.7 pg (ref 26.6–33.0)
MCHC: 32.6 g/dL (ref 31.5–35.7)
MCV: 100 fL — ABNORMAL HIGH (ref 79–97)
Monocytes Absolute: 0.6 10*3/uL (ref 0.1–0.9)
Monocytes: 8 %
Neutrophils Absolute: 5.1 10*3/uL (ref 1.4–7.0)
Neutrophils: 68 %
Platelets: 331 10*3/uL (ref 150–450)
RBC: 3.3 x10E6/uL — ABNORMAL LOW (ref 3.77–5.28)
RDW: 12.5 % (ref 11.7–15.4)
WBC: 7.5 10*3/uL (ref 3.4–10.8)

## 2019-05-03 LAB — CMP14+EGFR
ALT: 6 IU/L (ref 0–32)
AST: 11 IU/L (ref 0–40)
Albumin/Globulin Ratio: 1.5 (ref 1.2–2.2)
Albumin: 4 g/dL (ref 3.7–4.7)
Alkaline Phosphatase: 63 IU/L (ref 39–117)
BUN/Creatinine Ratio: 17 (ref 12–28)
BUN: 27 mg/dL (ref 8–27)
Bilirubin Total: 0.2 mg/dL (ref 0.0–1.2)
CO2: 19 mmol/L — ABNORMAL LOW (ref 20–29)
Calcium: 9.7 mg/dL (ref 8.7–10.3)
Chloride: 108 mmol/L — ABNORMAL HIGH (ref 96–106)
Creatinine, Ser: 1.55 mg/dL — ABNORMAL HIGH (ref 0.57–1.00)
GFR calc Af Amer: 36 mL/min/{1.73_m2} — ABNORMAL LOW (ref >59)
GFR calc non Af Amer: 32 mL/min/{1.73_m2} — ABNORMAL LOW (ref >59)
Globulin, Total: 2.7 g/dL (ref 1.5–4.5)
Glucose: 94 mg/dL (ref 65–99)
Potassium: 5 mmol/L (ref 3.5–5.2)
Sodium: 142 mmol/L (ref 134–144)
Total Protein: 6.7 g/dL (ref 6.0–8.5)

## 2019-05-03 LAB — URIC ACID: Uric Acid: 5.7 mg/dL (ref 3.1–7.9)

## 2019-05-03 LAB — LIPID PANEL
Chol/HDL Ratio: 2.8 ratio (ref 0.0–4.4)
Cholesterol, Total: 163 mg/dL (ref 100–199)
HDL: 58 mg/dL (ref >39)
LDL Chol Calc (NIH): 86 mg/dL (ref 0–99)
Triglycerides: 104 mg/dL (ref 0–149)
VLDL Cholesterol Cal: 19 mg/dL (ref 5–40)

## 2019-05-05 ENCOUNTER — Telehealth: Payer: Self-pay | Admitting: Family Medicine

## 2019-05-05 NOTE — Telephone Encounter (Signed)
Aware of lab results  

## 2019-06-23 ENCOUNTER — Ambulatory Visit (INDEPENDENT_AMBULATORY_CARE_PROVIDER_SITE_OTHER): Payer: Medicare Other | Admitting: *Deleted

## 2019-06-23 DIAGNOSIS — Z0000 Encounter for general adult medical examination without abnormal findings: Secondary | ICD-10-CM

## 2019-06-23 NOTE — Progress Notes (Signed)
MEDICARE ANNUAL WELLNESS VISIT  06/23/2019  Telephone Visit Disclaimer This Medicare AWV was conducted by telephone due to national recommendations for restrictions regarding the COVID-19 Pandemic (e.g. social distancing).  I verified, using two identifiers, that I am speaking with Tamara Gross or their authorized healthcare agent. I discussed the limitations, risks, security, and privacy concerns of performing an evaluation and management service by telephone and the potential availability of an in-person appointment in the future. The patient expressed understanding and agreed to proceed.   Subjective:  Tamara Gross is a 79 y.o. female patient of Stacks, Cletus Gash, MD who had a Medicare Annual Wellness Visit today via telephone. Tamara Gross is Retired and lives alone. she has 0 children. she reports that she is not socially active and does interact with friends/family regularly. she is minimally physically active and enjoys reading.  Patient Care Team: Claretta Fraise, MD as PCP - General (Family Medicine)  Advanced Directives 06/23/2019  Does Patient Have a Medical Advance Directive? No  Would patient like information on creating a medical advance directive? No - Patient declined    Hospital Utilization Over the Past 12 Months: # of hospitalizations or ER visits: 0 # of surgeries: 0  Review of Systems    Patient reports that her overall health is unchanged compared to last year.  History obtained from chart review  Patient Reported Readings (BP, Pulse, CBG, Weight, etc) none  Pain Assessment Pain : No/denies pain     Current Medications & Allergies (verified) Allergies as of 06/23/2019   No Known Allergies     Medication List       Accurate as of June 23, 2019 11:12 AM. If you have any questions, ask your nurse or doctor.        allopurinol 100 MG tablet Commonly known as: ZYLOPRIM Take 1 tablet (100 mg total) by mouth daily.   lisinopril 20 MG tablet Commonly known  as: ZESTRIL Take 1 tablet (20 mg total) by mouth daily.   multivitamin tablet Take 1 tablet by mouth daily.   NIFEdipine 60 MG 24 hr tablet Commonly known as: PROCARDIA XL/NIFEDICAL XL Take 1 tablet (60 mg total) by mouth daily.   raloxifene 60 MG tablet Commonly known as: Evista Take 1 tablet (60 mg total) by mouth daily. For bone health   Vitamin D 50 MCG (2000 UT) Caps Take 1 capsule by mouth.       History (reviewed): Past Medical History:  Diagnosis Date  . Gout   . Hypertension   . Osteoporosis    Past Surgical History:  Procedure Laterality Date  . TONSILLECTOMY     Family History  Problem Relation Age of Onset  . Cancer Mother   . Deep vein thrombosis Father   . Hypertension Sister   . Hypertension Brother    Social History   Socioeconomic History  . Marital status: Widowed    Spouse name: Not on file  . Number of children: 0  . Years of education: college  . Highest education level: Associate degree: academic program  Occupational History  . Occupation: Retired  Tobacco Use  . Smoking status: Never Smoker  . Smokeless tobacco: Never Used  Substance and Sexual Activity  . Alcohol use: Never  . Drug use: Never  . Sexual activity: Not Currently  Other Topics Concern  . Not on file  Social History Narrative  . Not on file   Social Determinants of Health   Financial Resource Strain: Low  Risk   . Difficulty of Paying Living Expenses: Not very hard  Food Insecurity: No Food Insecurity  . Worried About Programme researcher, broadcasting/film/video in the Last Year: Never true  . Ran Out of Food in the Last Year: Never true  Transportation Needs: No Transportation Needs  . Lack of Transportation (Medical): No  . Lack of Transportation (Non-Medical): No  Physical Activity: Inactive  . Days of Exercise per Week: 0 days  . Minutes of Exercise per Session: 0 min  Stress: No Stress Concern Present  . Feeling of Stress : Only a little  Social Connections: Somewhat Isolated    . Frequency of Communication with Friends and Family: Three times a week  . Frequency of Social Gatherings with Friends and Family: Three times a week  . Attends Religious Services: 1 to 4 times per year  . Active Member of Clubs or Organizations: No  . Attends Banker Meetings: Never  . Marital Status: Widowed    Activities of Daily Living In your present state of health, do you have any difficulty performing the following activities: 06/23/2019  Hearing? N  Vision? N  Difficulty concentrating or making decisions? N  Walking or climbing stairs? N  Dressing or bathing? N  Doing errands, shopping? N  Preparing Food and eating ? N  Using the Toilet? N  In the past six months, have you accidently leaked urine? N  Do you have problems with loss of bowel control? N  Managing your Medications? N  Managing your Finances? N  Housekeeping or managing your Housekeeping? N  Some recent data might be hidden    Patient Education/ Literacy How often do you need to have someone help you when you read instructions, pamphlets, or other written materials from your doctor or pharmacy?: 1 - Never What is the last grade level you completed in school?: 12  Exercise Current Exercise Habits: The patient does not participate in regular exercise at present  Diet Patient reports consuming 2 meals a day and 0 snack(s) a day Patient reports that her primary diet is: Regular Patient reports that she does have regular access to food.   Depression Screen PHQ 2/9 Scores 06/23/2019 06/23/2019 05/02/2019 06/22/2018 06/02/2018 05/12/2018 11/10/2017  PHQ - 2 Score 1 1 0 0 0 0 0     Fall Risk Fall Risk  06/23/2019 05/02/2019 06/22/2018 06/02/2018 05/12/2018  Falls in the past year? 0 0 0 0 0  Number falls in past yr: - 0 - - -  Injury with Fall? - 0 - - -  Follow up - Falls evaluation completed - - -     Objective:  Tamara Gross seemed alert and oriented and she participated appropriately during our  telephone visit.  Blood Pressure Weight BMI  BP Readings from Last 3 Encounters:  05/02/19 134/81  07/01/18 115/68  06/22/18 133/73   Wt Readings from Last 3 Encounters:  05/02/19 106 lb 9.6 oz (48.4 kg)  07/01/18 114 lb 9.6 oz (52 kg)  06/22/18 116 lb 3.2 oz (52.7 kg)   BMI Readings from Last 1 Encounters:  05/02/19 20.14 kg/m    *Unable to obtain current vital signs, weight, and BMI due to telephone visit type  Hearing/Vision  . Tamara Gross did not seem to have difficulty with hearing/understanding during the telephone conversation . Reports that she has not had a formal eye exam by an eye care professional within the past year . Reports that she has not had a  formal hearing evaluation within the past year *Unable to fully assess hearing and vision during telephone visit type  Cognitive Function: 6CIT Screen 06/23/2019  What Year? 0 points  What month? 0 points  What time? 0 points  Count back from 20 0 points  Months in reverse 0 points  Repeat phrase 0 points  Total Score 0   (Normal:0-7, Significant for Dysfunction: >8)  Normal Cognitive Function Screening: Yes   Immunization & Health Maintenance Record Immunization History  Administered Date(s) Administered  . Fluad Quad(high Dose 65+) 02/09/2019  . Influenza, High Dose Seasonal PF 02/09/2017, 01/28/2018  . Influenza,inj,Quad PF,6+ Mos 01/31/2013, 01/29/2014, 01/30/2015, 01/20/2016  . Pneumococcal Conjugate-13 11/05/2014  . Pneumococcal Polysaccharide-23 07/29/2011  . Tdap 05/07/2014  . Zoster 07/04/2013    Health Maintenance  Topic Date Due  . DEXA SCAN  03/11/2005  . TETANUS/TDAP  05/07/2024  . INFLUENZA VACCINE  Completed  . PNA vac Low Risk Adult  Completed       Assessment  This is a routine wellness examination for Tamara Gross.  Health Maintenance: Due or Overdue Health Maintenance Due  Topic Date Due  . DEXA SCAN  03/11/2005    Tamara Gross does not need a referral for [employer]  Assistance: Care Management:   no Social Work:    no Prescription Assistance:  no Nutrition/Diabetes Education:  not applicable   Plan:  Personalized Goals Goals Addressed            This Visit's Progress   . Prevent falls (pt-stated)        Personalized Health Maintenance & Screening Recommendations  Bone densitometry screening  Lung Cancer Screening Recommended: no (Low Dose CT Chest recommended if Age 66-80 years, 30 pack-year currently smoking OR have quit w/in past 15 years) Hepatitis C Screening recommended: no HIV Screening recommended: no  Advanced Directives: Written information was not prepared per patient's request.  Referrals & Orders No orders of the defined types were placed in this encounter.   Follow-up Plan . Follow-up with Tamara Claude, MD as planned . Dexa scan at July appt . Pt is doing fairly well. She used to be very active before Covid. She only viists her brother or sister now. She does not have children and lives alone. She denies depression from isolation. She eats healthy meals. Voices no concerns at this time. Has a CPE in July 2021   I have personally reviewed and noted the following in the patient's chart:   . Medical and social history . Use of alcohol, tobacco or illicit drugs  . Current medications and supplements . Functional ability and status . Nutritional status . Physical activity . Advanced directives . List of other physicians . Hospitalizations, surgeries, and ER visits in previous 12 months . Vitals . Screenings to include cognitive, depression, and falls . Referrals and appointments  In addition, I have reviewed and discussed with Tamara Gross certain preventive protocols, quality metrics, and best practice recommendations. A written personalized care plan for preventive services as well as general preventive health recommendations is available and can be mailed to the patient at her request.      Hilton Cork  Charmaine,LPN  09/20/6946

## 2019-07-13 ENCOUNTER — Telehealth: Payer: Self-pay | Admitting: Family Medicine

## 2019-07-13 ENCOUNTER — Other Ambulatory Visit: Payer: Self-pay | Admitting: Family Medicine

## 2019-07-13 DIAGNOSIS — I10 Essential (primary) hypertension: Secondary | ICD-10-CM

## 2019-07-13 NOTE — Chronic Care Management (AMB) (Signed)
  Chronic Care Management   Note  07/13/2019 Name: Tamara Gross MRN: [id number] DOB: November 20, 1940  Tamara Gross is a 79 y.o. year old female who is a primary care patient of Tamara Fraise, MD. I reached out to Tamara Gross by phone today in response to a referral sent by Tamara Gross's health plan.     Tamara Gross was given information about Chronic Care Management services today including:  1. CCM service includes personalized support from designated clinical staff supervised by her physician, including individualized plan of care and coordination with other care providers 2. 24/7 contact phone numbers for assistance for urgent and routine care needs. 3. Service will only be billed when office clinical staff spend 20 minutes or more in a month to coordinate care. 4. Only one practitioner may furnish and bill the service in a calendar month. 5. The patient may stop CCM services at any time (effective at the end of the month) by phone call to the office staff. 6. The patient will be responsible for cost sharing (co-pay) of up to 20% of the service fee (after annual deductible is met).  Patient agreed to services and verbal consent obtained.   Follow up plan: Telephone appointment with care management team member scheduled for:01/31/2020.  [place], Tamara Gross [id number] Direct Dial: [phone] [email or web address]

## 2019-07-24 ENCOUNTER — Other Ambulatory Visit: Payer: Self-pay

## 2019-07-24 ENCOUNTER — Encounter: Payer: Self-pay | Admitting: Family Medicine

## 2019-07-24 ENCOUNTER — Ambulatory Visit (HOSPITAL_COMMUNITY)
Admission: RE | Admit: 2019-07-24 | Discharge: 2019-07-24 | Disposition: A | Discharge status: Home / Self Care | Payer: Medicare Other | Source: Ambulatory Visit | Attending: Family Medicine | Admitting: Family Medicine

## 2019-07-24 ENCOUNTER — Ambulatory Visit (INDEPENDENT_AMBULATORY_CARE_PROVIDER_SITE_OTHER): Payer: Medicare Other | Admitting: Family Medicine

## 2019-07-24 VITALS — BP 137/73 | HR 98 | Temp 98.3°F | Resp 20 | Ht 61.0 in | Wt 107.0 lb

## 2019-07-24 DIAGNOSIS — S0990XA Unspecified injury of head, initial encounter: Secondary | ICD-10-CM

## 2019-07-24 DIAGNOSIS — S0512XA Contusion of eyeball and orbital tissues, left eye, initial encounter: Secondary | ICD-10-CM

## 2019-07-24 NOTE — Progress Notes (Signed)
Subjective  HPI: [name] is a 79 year old female who presents to clinic today with chief complaint of facial trauma/injury from a recent fall. [name] tripped on a street curb this morning approximately 45 minutes ago and hit the left side of her face on the cement. Denies any headache or blurred vision or photophobia.  Pain is mild.  ROS: She denies headache but has noticed some blurry vision. She denies pain and swelling in any other location besides the left orbital area.  Meds: [name] does not take any blood thinners (such as aspirin).  PMH: She does not have a PMH of falls.  SH: [name] lives alone, but her sister is accompanying her to the doctor's office after the fall.  Objective Vitals with BMI 07/24/2019 05/02/2019 07/01/2018  Height 5\' 1"  5\' 1"  -  Weight 107 lbs 106 lbs 10 oz 114 lbs 10 oz  BMI 20.23 20.15 21.66  Systolic 137 134  Diastolic 73 81 68  Pulse 98 79 86    HEENT: Eyes = PERRLA, normal EOMs, peripheral vision intact. Hematoma noted above left eyebrow. Laceration noted below left eye. Slight pain and "stinging" sensation on palpation. Lungs = CTAB. CV = RRR. +Tachycardia. Neuro = No tingling or loss of sensation on either side of the face. Normal strength in extremities.  Assessment Traumatic left orbital injury with primarily hematoma and laceration.  Plan  Recommend CT scan without contrast to rule out subdural hematoma. Tylenol PRN and Ice/Heat pack PRN for pain and swelling.  Problem List Items Addressed This Visit    None    Visit Diagnoses    Traumatic orbital hematoma, left, initial encounter    -  Primary   Relevant Orders   CT Head Wo Contrast (Completed)   Traumatic injury of head, initial encounter       Relevant Orders   CT Head Wo Contrast (Completed)      Patient was seen and examined with , PA student.  Agree with assessment and plan above.  Gave patient all of the warning signs and what to watch out for  with head trauma, she has her sister with her who will have her stay overnight with her to watch her as well and she will go over for a CT had of contrast to rule out any hematoma.  No signs that would be concerning of orbital fracture. 782, MD [facility] 07/24/2019, 1:21 PM

## 2019-07-27 DIAGNOSIS — Z23 Encounter for immunization: Secondary | ICD-10-CM | POA: Diagnosis not present

## 2019-08-18 DIAGNOSIS — Z23 Encounter for immunization: Secondary | ICD-10-CM | POA: Diagnosis not present

## 2019-10-30 ENCOUNTER — Encounter: Payer: Self-pay | Admitting: Family Medicine

## 2019-10-30 ENCOUNTER — Other Ambulatory Visit: Payer: Self-pay

## 2019-10-30 ENCOUNTER — Ambulatory Visit (INDEPENDENT_AMBULATORY_CARE_PROVIDER_SITE_OTHER): Payer: Medicare Other | Admitting: Family Medicine

## 2019-10-30 VITALS — BP 135/76 | HR 98 | Temp 98.2°F | Resp 20 | Ht 61.0 in | Wt 103.4 lb

## 2019-10-30 DIAGNOSIS — D638 Anemia in other chronic diseases classified elsewhere: Secondary | ICD-10-CM

## 2019-10-30 DIAGNOSIS — M810 Age-related osteoporosis without current pathological fracture: Secondary | ICD-10-CM

## 2019-10-30 DIAGNOSIS — N1832 Chronic kidney disease, stage 3b: Secondary | ICD-10-CM

## 2019-10-30 DIAGNOSIS — M1000 Idiopathic gout, unspecified site: Secondary | ICD-10-CM

## 2019-10-30 DIAGNOSIS — Z1159 Encounter for screening for other viral diseases: Secondary | ICD-10-CM

## 2019-10-30 DIAGNOSIS — Z780 Asymptomatic menopausal state: Secondary | ICD-10-CM

## 2019-10-30 DIAGNOSIS — R300 Dysuria: Secondary | ICD-10-CM

## 2019-10-30 DIAGNOSIS — E559 Vitamin D deficiency, unspecified: Secondary | ICD-10-CM

## 2019-10-30 DIAGNOSIS — I10 Essential (primary) hypertension: Secondary | ICD-10-CM

## 2019-10-30 LAB — URINALYSIS, COMPLETE
Bilirubin, UA: NEGATIVE
Glucose, UA: NEGATIVE
Ketones, UA: NEGATIVE
Leukocytes,UA: NEGATIVE
Nitrite, UA: NEGATIVE
Protein,UA: NEGATIVE
RBC, UA: NEGATIVE
Specific Gravity, UA: 1.01 (ref 1.005–1.030)
Urobilinogen, Ur: 0.2 mg/dL (ref 0.2–1.0)
pH, UA: 5 (ref 5.0–7.5)

## 2019-10-30 LAB — MICROSCOPIC EXAMINATION
Bacteria, UA: NONE SEEN
RBC, Urine: NONE SEEN /hpf (ref 0–2)
Renal Epithel, UA: NONE SEEN /hpf

## 2019-10-30 MED ORDER — NIFEDIPINE ER OSMOTIC RELEASE 60 MG PO TB24: 60.0000 mg | ORAL_TABLET | Freq: Every day | ORAL | 1 refills | Status: DC

## 2019-10-30 MED ORDER — LISINOPRIL 20 MG PO TABS: 20.0000 mg | ORAL_TABLET | Freq: Every day | ORAL | 3 refills | Status: DC

## 2019-10-30 NOTE — Progress Notes (Addendum)
Subjective:  Patient ID: Tamara Gross, female    DOB: 11/07/1940  Age: 79 y.o. MRN: [id number]  CC: Medical Management of Chronic Issues   HPI Tamara Gross presents for  follow-up of hypertension. Patient has no history of headache chest pain or shortness of breath or recent cough. Patient also denies symptoms of TIA such as focal numbness or weakness. Patient denies side effects from medication. States taking it regularly.     History Tamara Gross has a past medical history of Gout, Hypertension, and Osteoporosis.   Tamara Gross has a past surgical history that includes Tonsillectomy.   Her family history includes Cancer in her mother; Deep vein thrombosis in her father; Hypertension in her brother and sister.Tamara Gross reports that Tamara Gross has never smoked. Tamara Gross has never used smokeless tobacco. Tamara Gross reports that Tamara Gross does not drink alcohol and does not use drugs.  Current Outpatient Medications on File Prior to Visit  Medication Sig Dispense Refill  . allopurinol (ZYLOPRIM) 100 MG tablet Take 1 tablet (100 mg total) by mouth daily. 90 tablet 3  . Cholecalciferol (VITAMIN D) 2000 UNITS CAPS Take 1 capsule by mouth.    . Multiple Vitamin (MULTIVITAMIN) tablet Take 1 tablet by mouth daily.    . raloxifene (EVISTA) 60 MG tablet Take 1 tablet (60 mg total) by mouth daily. For bone health 90 tablet 3   No current facility-administered medications on file prior to visit.    ROS Review of Systems  Constitutional: Negative.   HENT: Negative for congestion.   Eyes: Negative for visual disturbance.  Respiratory: Negative for shortness of breath.   Cardiovascular: Negative for chest pain.  Gastrointestinal: Negative for abdominal pain, constipation, diarrhea, nausea and vomiting.  Genitourinary: Positive for dysuria (intermittent burning) and flank pain (mild, occasional). Negative for difficulty urinating.  Musculoskeletal: Negative for arthralgias and myalgias.  Neurological: Negative for headaches.    Psychiatric/Behavioral: Negative for sleep disturbance.    Objective:  BP 135/76   Pulse 98   Temp 98.2 F (36.8 C) (Temporal)   Resp 20   Ht '5\' 1"'  (1.549 m)   Wt 103 lb 6 oz (46.9 kg)   SpO2 98%   BMI 19.53 kg/m   BP Readings from Last 3 Encounters:  10/30/19 135/76  07/24/19 137/73  05/02/19 134/81    Wt Readings from Last 3 Encounters:  10/30/19 103 lb 6 oz (46.9 kg)  07/24/19 107 lb (48.5 kg)  05/02/19 106 lb 9.6 oz (48.4 kg)     Physical Exam Constitutional:      General: Tamara Gross is not in acute distress.    Appearance: Tamara Gross is well-developed.  HENT:     Head: Normocephalic and atraumatic.  Eyes:     Conjunctiva/sclera: Conjunctivae normal.     Pupils: Pupils are equal, round, and reactive to light.  Neck:     Thyroid: No thyromegaly.  Cardiovascular:     Rate and Rhythm: Normal rate and regular rhythm.     Heart sounds: Normal heart sounds. No murmur heard.   Pulmonary:     Effort: Pulmonary effort is normal. No respiratory distress.     Breath sounds: Normal breath sounds. No wheezing or rales.  Abdominal:     General: Bowel sounds are normal. There is no distension.     Palpations: Abdomen is soft.     Tenderness: There is no abdominal tenderness.  Musculoskeletal:        General: Normal range of motion.     Cervical back: Normal  range of motion and neck supple.  Lymphadenopathy:     Cervical: No cervical adenopathy.  Skin:    General: Skin is warm and dry.  Neurological:     Mental Status: Tamara Gross is alert and oriented to person, place, and time.  Psychiatric:        Behavior: Behavior normal.        Thought Content: Thought content normal.        Judgment: Judgment normal.       Assessment & Plan:   Tamara Gross was seen today for medical management of chronic issues.  Diagnoses and all orders for this visit:  Postmenopausal -     DG Bone Density; Future  Age-related osteoporosis without current pathological fracture -     DG Bone Density;  Future  Stage 3b chronic kidney disease -     CBC with Differential/Platelet -     CMP14+EGFR  Essential hypertension -     CBC with Differential/Platelet -     CMP14+EGFR -     lisinopril (ZESTRIL) 20 MG tablet; Take 1 tablet (20 mg total) by mouth daily. -     NIFEdipine (PROCARDIA XL/NIFEDICAL XL) 60 MG 24 hr tablet; Take 1 tablet (60 mg total) by mouth daily.  Vitamin D deficiency -     VITAMIN D 25 Hydroxy (Vit-D Deficiency, Fractures)  Anemia of chronic disease -     CBC with Differential/Platelet  Idiopathic gout, unspecified chronicity, unspecified site -     Uric acid  Dysuria -     Urinalysis, Complete -     Urine Culture  Need for hepatitis C screening test -     Hepatitis C antibody   Allergies as of 10/30/2019   No Known Allergies     Medication List       Accurate as of October 30, 2019  8:38 AM. If you have any questions, ask your nurse or doctor.        allopurinol 100 MG tablet Commonly known as: ZYLOPRIM Take 1 tablet (100 mg total) by mouth daily.   lisinopril 20 MG tablet Commonly known as: ZESTRIL Take 1 tablet (20 mg total) by mouth daily.   multivitamin tablet Take 1 tablet by mouth daily.   NIFEdipine 60 MG 24 hr tablet Commonly known as: PROCARDIA XL/NIFEDICAL XL Take 1 tablet (60 mg total) by mouth daily.   raloxifene 60 MG tablet Commonly known as: Evista Take 1 tablet (60 mg total) by mouth daily. For bone health   Vitamin D 50 MCG (2000 UT) Caps Take 1 capsule by mouth.       Meds ordered this encounter  Medications  . lisinopril (ZESTRIL) 20 MG tablet    Sig: Take 1 tablet (20 mg total) by mouth daily.    Dispense:  90 tablet    Refill:  3  . NIFEdipine (PROCARDIA XL/NIFEDICAL XL) 60 MG 24 hr tablet    Sig: Take 1 tablet (60 mg total) by mouth daily.    Dispense:  90 each    Refill:  1      Follow-up: Return in about 6 months (around 05/01/2020) for [facility] physical.  Tamara Gross, M.D.

## 2019-10-30 NOTE — Progress Notes (Signed)
Hello Suriyah,  Your lab result is normal and/or stable.Some minor variations that are not significant are commonly marked abnormal, but do not represent any medical problem for you.  Best regards, Michiel Sivley, M.D.

## 2019-10-30 NOTE — Addendum Note (Signed)
Addended by: Mechele Claude on: 10/30/2019 08:38 AM   Modules accepted: Orders

## 2019-10-31 LAB — CBC WITH DIFFERENTIAL/PLATELET
Basophils Absolute: 0.1 10*3/uL (ref 0.0–0.2)
Basos: 1 %
EOS (ABSOLUTE): 0.2 10*3/uL (ref 0.0–0.4)
Eos: 3 %
Hematocrit: 33.2 % — ABNORMAL LOW (ref 34.0–46.6)
Hemoglobin: 10.7 g/dL — ABNORMAL LOW (ref 11.1–15.9)
Immature Grans (Abs): 0 10*3/uL (ref 0.0–0.1)
Immature Granulocytes: 0 %
Lymphocytes Absolute: 1.5 10*3/uL (ref 0.7–3.1)
Lymphs: 24 %
MCH: 31.2 pg (ref 26.6–33.0)
MCHC: 32.2 g/dL (ref 31.5–35.7)
MCV: 97 fL (ref 79–97)
Monocytes Absolute: 0.6 10*3/uL (ref 0.1–0.9)
Monocytes: 9 %
Neutrophils Absolute: 4 10*3/uL (ref 1.4–7.0)
Neutrophils: 63 %
Platelets: 288 10*3/uL (ref 150–450)
RBC: 3.43 x10E6/uL — ABNORMAL LOW (ref 3.77–5.28)
RDW: 13.5 % (ref 11.7–15.4)
WBC: 6.3 10*3/uL (ref 3.4–10.8)

## 2019-10-31 LAB — URINE CULTURE: Organism ID, Bacteria: NO GROWTH

## 2019-10-31 LAB — VITAMIN D 25 HYDROXY (VIT D DEFICIENCY, FRACTURES): Vit D, 25-Hydroxy: 32.2 ng/mL (ref 30.0–100.0)

## 2019-10-31 LAB — CMP14+EGFR
ALT: 7 IU/L (ref 0–32)
AST: 10 IU/L (ref 0–40)
Albumin/Globulin Ratio: 1.5 (ref 1.2–2.2)
Albumin: 4 g/dL (ref 3.7–4.7)
Alkaline Phosphatase: 65 IU/L (ref 48–121)
BUN/Creatinine Ratio: 19 (ref 12–28)
BUN: 32 mg/dL — ABNORMAL HIGH (ref 8–27)
Bilirubin Total: 0.3 mg/dL (ref 0.0–1.2)
CO2: 19 mmol/L — ABNORMAL LOW (ref 20–29)
Calcium: 9.4 mg/dL (ref 8.7–10.3)
Chloride: 108 mmol/L — ABNORMAL HIGH (ref 96–106)
Creatinine, Ser: 1.67 mg/dL — ABNORMAL HIGH (ref 0.57–1.00)
GFR calc Af Amer: 33 mL/min/{1.73_m2} — ABNORMAL LOW (ref >59)
GFR calc non Af Amer: 29 mL/min/{1.73_m2} — ABNORMAL LOW (ref >59)
Globulin, Total: 2.7 g/dL (ref 1.5–4.5)
Glucose: 91 mg/dL (ref 65–99)
Potassium: 4.9 mmol/L (ref 3.5–5.2)
Sodium: 139 mmol/L (ref 134–144)
Total Protein: 6.7 g/dL (ref 6.0–8.5)

## 2019-10-31 LAB — URIC ACID: Uric Acid: 5.8 mg/dL (ref 3.1–7.9)

## 2020-01-16 ENCOUNTER — Other Ambulatory Visit: Payer: Self-pay

## 2020-01-16 ENCOUNTER — Ambulatory Visit (INDEPENDENT_AMBULATORY_CARE_PROVIDER_SITE_OTHER): Payer: Medicare Other | Admitting: *Deleted

## 2020-01-16 DIAGNOSIS — Z23 Encounter for immunization: Secondary | ICD-10-CM

## 2020-01-22 ENCOUNTER — Other Ambulatory Visit: Payer: Self-pay | Admitting: Family Medicine

## 2020-01-22 DIAGNOSIS — I10 Essential (primary) hypertension: Secondary | ICD-10-CM

## 2020-01-31 ENCOUNTER — Telehealth: Payer: Self-pay | Admitting: *Deleted

## 2020-01-31 ENCOUNTER — Telehealth: Payer: Medicare Other | Admitting: *Deleted

## 2020-01-31 NOTE — Telephone Encounter (Signed)
  Chronic Care Management   Outreach Note  01/31/2020 Name: Tamara Gross MRN: [id number] DOB: [birth year]  Referred by: Mechele Claude, MD Reason for referral : Chronic Care Management (Initial Visit)   An unsuccessful Initial Telephone Visit was attempted today. The patient was referred to the case management team for assistance with care management and care coordination.   Clinical Goals: . Over the next 10 days, patient will be contacted by a Care Guide to reschedule their Initial CCM Visit . Over the next 30 days, patient will have an Initial CCM Visit with a member of the embedded CCM team to discuss self-management of their chronic medical conditions  Interventions and Plan . Chart reviewed in preparation for initial visit telephone call . Collaboration with other care team members as needed . Unsuccessful outreach to patient  . A HIPAA compliant phone message was left for the patient providing contact information and requesting a return call.  . Request sent to care guides to reach out and reschedule patient's initial visit   Demetrios Loll, BSN, RN-BC Embedded Chronic Care Manager [facility] Care Management Direct Dial: [phone]

## 2020-03-04 DIAGNOSIS — R8290 Unspecified abnormal findings in urine: Secondary | ICD-10-CM | POA: Diagnosis not present

## 2020-03-04 DIAGNOSIS — N3000 Acute cystitis without hematuria: Secondary | ICD-10-CM | POA: Diagnosis not present

## 2020-03-04 DIAGNOSIS — R309 Painful micturition, unspecified: Secondary | ICD-10-CM | POA: Diagnosis not present

## 2020-03-05 ENCOUNTER — Encounter: Payer: Self-pay | Admitting: Nurse Practitioner

## 2020-03-05 ENCOUNTER — Encounter: Payer: Medicare Other | Admitting: Nurse Practitioner

## 2020-03-05 NOTE — Progress Notes (Signed)
Patient wen tto urgent care yesterday and was treated.

## 2020-03-18 DIAGNOSIS — Z23 Encounter for immunization: Secondary | ICD-10-CM | POA: Diagnosis not present

## 2020-03-21 NOTE — Telephone Encounter (Signed)
Spoke to patient rescheduled for telephone initial outreach with Demetrios Loll RN CM on 04/16/2020. Patient given direct contact number to reschedule if needed.  Gwenevere Ghazi  Care Guide, Embedded Care Coordination [facility] Management  Direct Dial: [phone]

## 2020-03-21 NOTE — Telephone Encounter (Signed)
R/s

## 2020-04-16 ENCOUNTER — Ambulatory Visit: Payer: Medicare Other | Admitting: *Deleted

## 2020-04-16 DIAGNOSIS — N1832 Chronic kidney disease, stage 3b: Secondary | ICD-10-CM

## 2020-04-16 DIAGNOSIS — I10 Essential (primary) hypertension: Secondary | ICD-10-CM

## 2020-04-16 NOTE — Chronic Care Management (AMB) (Signed)
  Chronic Care Management   Initial Visit Note  04/16/2020 Name: Tamara Gross MRN: [id number] DOB: August 17, 1940  Referred by: Mechele Claude, MD Reason for referral : Chronic Care Management (Initial Visit)   Tamara Gross is a 79 y.o. year old female who is a primary care patient of Stacks, Broadus John, MD. The CCM team was consulted for assistance with chronic disease management and care coordination needs related to HTN, CKD Stage 3 and osteoporosis  Review of patient status, including review of consultants reports, relevant laboratory and other test results was performed prior to outreach.   I spoke with [name] by telephone today regarding management of their chronic medical conditions. They do not have any CCM or resource needs and feel that their medical conditions are well managed. They appreciated the outreach, but do not feel that CCM services are needed at this time. They will reach out in the future if a need arises.   SDOH (Social Determinants of Health) assessments performed: Yes See Care Plan activities for detailed interventions related to SDOH     Plan:  . The patient has been provided with contact information for the care management team and has been advised to call if they would like to re-enroll in the CCM program to receive care management and care coordination services.  . CCM enrollment status changed to "previously enrolled" as per patient request on 04/16/2020  to discontinue enrollment. Case closed to case management services in primary care home.  . Patient was provided with general disease management education and encouraged to follow-up with their PCP and specialists as recommended.   Demetrios Loll, BSN, RN-BC Embedded Chronic Care Manager [facility] Care Management Direct Dial: [phone]

## 2020-04-16 NOTE — Patient Instructions (Signed)
Plan:  . The patient has been provided with contact information for the care management team and has been advised to call if they would like to re-enroll in the CCM program to receive care management and care coordination services.  . CCM enrollment status changed to "previously enrolled" as per patient request on 04/16/2020  to discontinue enrollment. Case closed to case management services in primary care home.  . Patient was provided with general disease management education and encouraged to follow-up with their PCP and specialists as recommended.   Demetrios Loll, BSN, RN-BC Embedded Chronic Care Manager [facility] Care Management Direct Dial: [phone]

## 2020-04-23 ENCOUNTER — Other Ambulatory Visit: Payer: Self-pay | Admitting: Family Medicine

## 2020-04-23 DIAGNOSIS — I10 Essential (primary) hypertension: Secondary | ICD-10-CM

## 2020-05-02 ENCOUNTER — Ambulatory Visit (INDEPENDENT_AMBULATORY_CARE_PROVIDER_SITE_OTHER): Payer: Medicare Other

## 2020-05-02 ENCOUNTER — Encounter: Payer: Self-pay | Admitting: Family Medicine

## 2020-05-02 ENCOUNTER — Ambulatory Visit (INDEPENDENT_AMBULATORY_CARE_PROVIDER_SITE_OTHER): Payer: Medicare Other | Admitting: Family Medicine

## 2020-05-02 ENCOUNTER — Other Ambulatory Visit: Payer: Self-pay

## 2020-05-02 VITALS — BP 125/81 | HR 90 | Temp 96.6°F | Resp 20 | Ht 61.0 in | Wt 104.1 lb

## 2020-05-02 DIAGNOSIS — Z780 Asymptomatic menopausal state: Secondary | ICD-10-CM | POA: Diagnosis not present

## 2020-05-02 DIAGNOSIS — I10 Essential (primary) hypertension: Secondary | ICD-10-CM | POA: Diagnosis not present

## 2020-05-02 DIAGNOSIS — D638 Anemia in other chronic diseases classified elsewhere: Secondary | ICD-10-CM | POA: Diagnosis not present

## 2020-05-02 DIAGNOSIS — M810 Age-related osteoporosis without current pathological fracture: Secondary | ICD-10-CM

## 2020-05-02 DIAGNOSIS — N1832 Chronic kidney disease, stage 3b: Secondary | ICD-10-CM | POA: Diagnosis not present

## 2020-05-02 DIAGNOSIS — R8290 Unspecified abnormal findings in urine: Secondary | ICD-10-CM

## 2020-05-02 DIAGNOSIS — E559 Vitamin D deficiency, unspecified: Secondary | ICD-10-CM

## 2020-05-02 LAB — MICROSCOPIC EXAMINATION
RBC, Urine: NONE SEEN /hpf (ref 0–2)
WBC, UA: >30 /hpf — AB (ref 0–5)

## 2020-05-02 LAB — URINALYSIS, COMPLETE
Bilirubin, UA: NEGATIVE
Glucose, UA: NEGATIVE
Ketones, UA: NEGATIVE
Nitrite, UA: NEGATIVE
Protein,UA: NEGATIVE
RBC, UA: NEGATIVE
Specific Gravity, UA: <1.005 — ABNORMAL LOW (ref 1.005–1.030)
Urobilinogen, Ur: 0.2 mg/dL (ref 0.2–1.0)
pH, UA: 5 (ref 5.0–7.5)

## 2020-05-02 MED ORDER — SULFAMETHOXAZOLE-TRIMETHOPRIM 800-160 MG PO TABS: 1.0000 | ORAL_TABLET | Freq: Two times a day (BID) | ORAL | 0 refills | Status: DC

## 2020-05-02 MED ORDER — RALOXIFENE HCL 60 MG PO TABS: 60.0000 mg | ORAL_TABLET | Freq: Every day | ORAL | 3 refills | Status: DC

## 2020-05-02 NOTE — Progress Notes (Signed)
Subjective:  Patient ID: Tamara Gross, female    DOB: [birth year]  Age: 80 y.o. MRN: [id number]  CC: Medical Management of Chronic Issues   HPI Emmilee B Liz presents for  follow-up of hypertension. Patient has no history of headache chest pain or shortness of breath or recent cough. Patient also denies symptoms of TIA such as focal numbness or weakness. Patient denies side effects from medication. States taking it regularly.  burning with urination intermittently  and frequency for several days. It loks cloudy. Denies fever . No flank pain. No nausea, vomiting.    History Latreshia has a past medical history of Gout, Hypertension, and Osteoporosis.   She has a past surgical history that includes Tonsillectomy.   Her family history includes Cancer in her mother; Deep vein thrombosis in her father; Hypertension in her brother and sister.She reports that she has never smoked. She has never used smokeless tobacco. She reports that she does not drink alcohol and does not use drugs.  Current Outpatient Medications on File Prior to Visit  Medication Sig Dispense Refill  . allopurinol (ZYLOPRIM) 100 MG tablet Take 1 tablet (100 mg total) by mouth daily. 90 tablet 3  . Cholecalciferol (VITAMIN D) 2000 UNITS CAPS Take 1 capsule by mouth.    Marland Kitchen lisinopril (ZESTRIL) 20 MG tablet Take 1 tablet (20 mg total) by mouth daily. 90 tablet 3  . Multiple Vitamin (MULTIVITAMIN) tablet Take 1 tablet by mouth daily.    Marland Kitchen NIFEdipine (PROCARDIA XL/NIFEDICAL XL) 60 MG 24 hr tablet Take 1 tablet by mouth once daily 90 tablet 0   No current facility-administered medications on file prior to visit.    ROS Review of Systems  Constitutional: Negative.   HENT: Negative.   Eyes: Negative for visual disturbance.  Respiratory: Negative for shortness of breath.   Cardiovascular: Negative for chest pain.  Gastrointestinal: Negative for abdominal pain.  Musculoskeletal: Negative for arthralgias.    Objective:  BP  125/81   Pulse 90   Temp (!) 96.6 F (35.9 C) (Temporal)   Resp 20   Ht '5\' 1"'  (1.549 m)   Wt 104 lb 2 oz (47.2 kg)   SpO2 97%   BMI 19.67 kg/m   BP Readings from Last 3 Encounters:  05/02/20 125/81  10/30/19 135/76  07/24/19 137/73    Wt Readings from Last 3 Encounters:  05/02/20 104 lb 2 oz (47.2 kg)  10/30/19 103 lb 6 oz (46.9 kg)  07/24/19 107 lb (48.5 kg)     Physical Exam Constitutional:      General: She is not in acute distress.    Appearance: She is well-developed.  HENT:     Head: Normocephalic and atraumatic.  Eyes:     Conjunctiva/sclera: Conjunctivae normal.  Neck:     Thyroid: No thyromegaly.  Cardiovascular:     Rate and Rhythm: Normal rate and regular rhythm.     Heart sounds: Normal heart sounds. No murmur heard.   Pulmonary:     Effort: Pulmonary effort is normal. No respiratory distress.     Breath sounds: Normal breath sounds. No wheezing or rales.  Abdominal:     General: Bowel sounds are normal. There is no distension.     Palpations: Abdomen is soft.     Tenderness: There is no abdominal tenderness.  Musculoskeletal:        General: Normal range of motion.     Cervical back: Normal range of motion and neck supple.  Lymphadenopathy:  Cervical: No cervical adenopathy.  Skin:    General: Skin is warm and dry.  Neurological:     Mental Status: She is alert and oriented to person, place, and time.  Psychiatric:        Behavior: Behavior normal.        Thought Content: Thought content normal.        Judgment: Judgment normal.       Assessment & Plan:   Valeri was seen today for medical management of chronic issues.  Diagnoses and all orders for this visit:  Cloudy urine -     Urinalysis, Complete -     Urine Culture  Postmenopausal -     raloxifene (EVISTA) 60 MG tablet; Take 1 tablet (60 mg total) by mouth daily. For bone health -     DG Bone Density; Future  Primary hypertension  Anemia of chronic disease -     CBC  with Differential/Platelet  Vitamin D deficiency  Age-related osteoporosis without current pathological fracture -     DG Bone Density; Future  Stage 3b chronic kidney disease (HCC) -     CMP14+EGFR -     Uric acid  Other orders -     sulfamethoxazole-trimethoprim (BACTRIM DS) 800-160 MG tablet; Take 1 tablet by mouth 2 (two) times daily.   Allergies as of 05/02/2020   No Known Allergies     Medication List       Accurate as of May 02, 2020 10:21 AM. If you have any questions, ask your nurse or doctor.        allopurinol 100 MG tablet Commonly known as: ZYLOPRIM Take 1 tablet (100 mg total) by mouth daily.   lisinopril 20 MG tablet Commonly known as: ZESTRIL Take 1 tablet (20 mg total) by mouth daily.   multivitamin tablet Take 1 tablet by mouth daily.   NIFEdipine 60 MG 24 hr tablet Commonly known as: PROCARDIA XL/NIFEDICAL XL Take 1 tablet by mouth once daily   raloxifene 60 MG tablet Commonly known as: Evista Take 1 tablet (60 mg total) by mouth daily. For bone health   sulfamethoxazole-trimethoprim 800-160 MG tablet Commonly known as: BACTRIM DS Take 1 tablet by mouth 2 (two) times daily. Started by: Claretta Fraise, MD   Vitamin D 50 MCG (2000 UT) Caps Take 1 capsule by mouth.       Meds ordered this encounter  Medications  . raloxifene (EVISTA) 60 MG tablet    Sig: Take 1 tablet (60 mg total) by mouth daily. For bone health    Dispense:  90 tablet    Refill:  3  . sulfamethoxazole-trimethoprim (BACTRIM DS) 800-160 MG tablet    Sig: Take 1 tablet by mouth 2 (two) times daily.    Dispense:  14 tablet    Refill:  0      Follow-up: Return in about 6 months (around 10/30/2020).  Claretta Fraise, M.D.

## 2020-05-03 LAB — CMP14+EGFR
ALT: 7 IU/L (ref 0–32)
AST: 13 IU/L (ref 0–40)
Albumin/Globulin Ratio: 1.6 (ref 1.2–2.2)
Albumin: 4.2 g/dL (ref 3.7–4.7)
Alkaline Phosphatase: 66 IU/L (ref 44–121)
BUN/Creatinine Ratio: 17 (ref 12–28)
BUN: 26 mg/dL (ref 8–27)
Bilirubin Total: 0.3 mg/dL (ref 0.0–1.2)
CO2: 19 mmol/L — ABNORMAL LOW (ref 20–29)
Calcium: 9.3 mg/dL (ref 8.7–10.3)
Chloride: 104 mmol/L (ref 96–106)
Creatinine, Ser: 1.51 mg/dL — ABNORMAL HIGH (ref 0.57–1.00)
GFR calc Af Amer: 37 mL/min/{1.73_m2} — ABNORMAL LOW (ref >59)
GFR calc non Af Amer: 32 mL/min/{1.73_m2} — ABNORMAL LOW (ref >59)
Globulin, Total: 2.7 g/dL (ref 1.5–4.5)
Glucose: 107 mg/dL — ABNORMAL HIGH (ref 65–99)
Potassium: 4.6 mmol/L (ref 3.5–5.2)
Sodium: 137 mmol/L (ref 134–144)
Total Protein: 6.9 g/dL (ref 6.0–8.5)

## 2020-05-03 LAB — CBC WITH DIFFERENTIAL/PLATELET
Basophils Absolute: 0 10*3/uL (ref 0.0–0.2)
Basos: 1 %
EOS (ABSOLUTE): 0.1 10*3/uL (ref 0.0–0.4)
Eos: 3 %
Hematocrit: 34.5 % (ref 34.0–46.6)
Hemoglobin: 11.4 g/dL (ref 11.1–15.9)
Immature Grans (Abs): 0 10*3/uL (ref 0.0–0.1)
Immature Granulocytes: 0 %
Lymphocytes Absolute: 1.5 10*3/uL (ref 0.7–3.1)
Lymphs: 31 %
MCH: 31.8 pg (ref 26.6–33.0)
MCHC: 33 g/dL (ref 31.5–35.7)
MCV: 96 fL (ref 79–97)
Monocytes Absolute: 0.5 10*3/uL (ref 0.1–0.9)
Monocytes: 11 %
Neutrophils Absolute: 2.6 10*3/uL (ref 1.4–7.0)
Neutrophils: 54 %
Platelets: 285 10*3/uL (ref 150–450)
RBC: 3.58 x10E6/uL — ABNORMAL LOW (ref 3.77–5.28)
RDW: 12.4 % (ref 11.7–15.4)
WBC: 4.7 10*3/uL (ref 3.4–10.8)

## 2020-05-03 LAB — URIC ACID: Uric Acid: 5.7 mg/dL (ref 3.1–7.9)

## 2020-05-06 ENCOUNTER — Other Ambulatory Visit: Payer: Self-pay | Admitting: Family Medicine

## 2020-05-06 MED ORDER — ALENDRONATE SODIUM 70 MG PO TABS: 70.0000 mg | ORAL_TABLET | ORAL | 11 refills | Status: DC

## 2020-05-09 LAB — URINE CULTURE

## 2020-07-08 ENCOUNTER — Ambulatory Visit (INDEPENDENT_AMBULATORY_CARE_PROVIDER_SITE_OTHER): Payer: Medicare Other | Admitting: *Deleted

## 2020-07-08 DIAGNOSIS — Z0000 Encounter for general adult medical examination without abnormal findings: Secondary | ICD-10-CM | POA: Diagnosis not present

## 2020-07-08 NOTE — Progress Notes (Signed)
MEDICARE ANNUAL WELLNESS VISIT  07/08/2020  Telephone Visit Disclaimer This Medicare AWV was conducted by telephone due to national recommendations for restrictions regarding the COVID-19 Pandemic (e.g. social distancing).  I verified, using two identifiers, that I am speaking with Tamara Gross or their authorized healthcare agent. I discussed the limitations, risks, security, and privacy concerns of performing an evaluation and management service by telephone and the potential availability of an in-person appointment in the future. The patient expressed understanding and agreed to proceed.  Location of Patient: home Location of Provider (nurse):office  Subjective:    Tamara Gross is a 80 y.o. female patient of Stacks, Broadus JohnWarren, MD who had a Medicare Annual Wellness Visit today via telephone. Tamara Gross is Retired and lives alone. she has 0 children. she reports that she is socially active and does interact with friends/family regularly. she is not physically active and enjoys reading.  Patient Care Team: Mechele ClaudeStacks, Warren, MD as PCP - General (Family Medicine)  Advanced Directives 07/08/2020 06/23/2019  Does Patient Have a Medical Advance Directive? No No  Would patient like information on creating a medical advance directive? No - Patient declined No - Patient declined    Hospital Utilization Over the Past 12 Months: # of hospitalizations or ER visits: 0 # of surgeries: 0  Review of Systems    Patient reports that her overall health is unchanged compared to last year.  History obtained from chart review and the patient  Patient Reported Readings (BP, Pulse, CBG, Weight, etc) none  Pain Assessment Pain : No/denies pain     Current Medications & Allergies (verified) Allergies as of 07/08/2020   No Known Allergies     Medication List       Accurate as of July 08, 2020  2:17 PM. If you have any questions, ask your nurse or doctor.        STOP taking these medications    sulfamethoxazole-trimethoprim 800-160 MG tablet Commonly known as: BACTRIM DS     TAKE these medications   alendronate 70 MG tablet Commonly known as: FOSAMAX Take 1 tablet (70 mg total) by mouth every 7 (seven) days. Take with a full glass of water on an empty stomach. Do not lay down for at least 2 hours   allopurinol 100 MG tablet Commonly known as: ZYLOPRIM Take 1 tablet (100 mg total) by mouth daily.   lisinopril 20 MG tablet Commonly known as: ZESTRIL Take 1 tablet (20 mg total) by mouth daily.   multivitamin tablet Take 1 tablet by mouth daily.   NIFEdipine 60 MG 24 hr tablet Commonly known as: PROCARDIA XL/NIFEDICAL XL Take 1 tablet by mouth once daily   Vitamin D 50 MCG (2000 UT) Caps Take 1 capsule by mouth.       History (reviewed): Past Medical History:  Diagnosis Date  . Gout   . Hypertension   . Osteoporosis    Past Surgical History:  Procedure Laterality Date  . TONSILLECTOMY     Family History  Problem Relation Age of Onset  . Cancer Mother   . Deep vein thrombosis Father   . Hypertension Sister   . Hypertension Brother    Social History   Socioeconomic History  . Marital status: Widowed    Spouse name: Not on file  . Number of children: 0  . Years of education: college  . Highest education level: Associate degree: academic program  Occupational History  . Occupation: Retired  Tobacco Use  .  Smoking status: Never Smoker  . Smokeless tobacco: Never Used  Vaping Use  . Vaping Use: Never used  Substance and Sexual Activity  . Alcohol use: Never  . Drug use: Never  . Sexual activity: Not Currently  Other Topics Concern  . Not on file  Social History Narrative  . Not on file   Social Determinants of Health   Financial Resource Strain: Low Risk   . Difficulty of Paying Living Expenses: Not hard at all  Food Insecurity: No Food Insecurity  . Worried About Programme researcher, broadcasting/film/video in the Last Year: Never true  . Ran Out of Food in the  Last Year: Never true  Transportation Needs: No Transportation Needs  . Lack of Transportation (Medical): No  . Lack of Transportation (Non-Medical): No  Physical Activity: Inactive  . Days of Exercise per Week: 0 days  . Minutes of Exercise per Session: 0 min  Stress: No Stress Concern Present  . Feeling of Stress : Not at all  Social Connections: Moderately Integrated  . Frequency of Communication with Friends and Family: Three times a week  . Frequency of Social Gatherings with Friends and Family: Once a week  . Attends Religious Services: More than 4 times per year  . Active Member of Clubs or Organizations: Yes  . Attends Banker Meetings: More than 4 times per year  . Marital Status: Widowed    Activities of Daily Living In your present state of health, do you have any difficulty performing the following activities: 07/08/2020 04/16/2020  Hearing? N N  Vision? N N  Difficulty concentrating or making decisions? N N  Walking or climbing stairs? N N  Dressing or bathing? N N  Doing errands, shopping? N N  Preparing Food and eating ? N -  Using the Toilet? N -  In the past six months, have you accidently leaked urine? N -  Do you have problems with loss of bowel control? N -  Managing your Medications? N -  Managing your Finances? N -  Housekeeping or managing your Housekeeping? N -  Some recent data might be hidden    Patient Education/ Literacy How often do you need to have someone help you when you read instructions, pamphlets, or other written materials from your doctor or pharmacy?: 1 - Never What is the last grade level you completed in school?: 13  Exercise Current Exercise Habits: The patient does not participate in regular exercise at present, Exercise limited by: None identified  Diet Patient reports consuming 2 meals a day and 1 snack(s) a day Patient reports that her primary diet is: Regular Patient reports that she does have regular access to  food.   Depression Screen PHQ 2/9 Scores 07/08/2020 05/02/2020 10/30/2019 07/24/2019 06/23/2019 06/23/2019 05/02/2019  PHQ - 2 Score 0 0 0 0 1 1 0     Fall Risk Fall Risk  07/08/2020 05/02/2020 04/16/2020 10/30/2019 07/24/2019  Falls in the past year? 0 0 1 1 1   Number falls in past yr: - - 0 0 0  Injury with Fall? - - 1 0 1  Comment - - - - knot on forehead, cut under eye  Risk for fall due to : - - History of fall(s) History of fall(s) -  Follow up - Falls evaluation completed Falls prevention discussed Falls evaluation completed -     Objective:  Tamara Gross seemed alert and oriented and she participated appropriately during our telephone visit.  Blood  Pressure Weight BMI  BP Readings from Last 3 Encounters:  05/02/20 125/81  10/30/19 135/76  07/24/19 137/73   Wt Readings from Last 3 Encounters:  05/02/20 104 lb 2 oz (47.2 kg)  10/30/19 103 lb 6 oz (46.9 kg)  07/24/19 107 lb (48.5 kg)   BMI Readings from Last 1 Encounters:  05/02/20 19.67 kg/m    *Unable to obtain current vital signs, weight, and BMI due to telephone visit type  Hearing/Vision  . Tamara Gross did not seem to have difficulty with hearing/understanding during the telephone conversation . Reports that she has not had a formal eye exam by an eye care professional within the past year . Reports that she has not had a formal hearing evaluation within the past year *Unable to fully assess hearing and vision during telephone visit type  Cognitive Function: 6CIT Screen 07/08/2020 06/23/2019  What Year? 0 points 0 points  What month? 0 points 0 points  What time? 0 points 0 points  Count back from 20 2 points 0 points  Months in reverse 0 points 0 points  Repeat phrase 0 points 0 points  Total Score 2 0   (Normal:0-7, Significant for Dysfunction: >8)  Normal Cognitive Function Screening: Yes   Immunization & Health Maintenance Record Immunization History  Administered Date(s) Administered  . Fluad Quad(high Dose 65+)  02/09/2019, 01/16/2020  . Influenza, High Dose Seasonal PF 02/09/2017, 01/28/2018  . Influenza,inj,Quad PF,6+ Mos 01/31/2013, 01/29/2014, 01/30/2015, 01/20/2016  . Pneumococcal Conjugate-13 11/05/2014  . Pneumococcal Polysaccharide-23 07/29/2011  . Tdap 05/07/2014  . Zoster 07/04/2013    Health Maintenance  Topic Date Due  . COVID-19 Vaccine (1) Never done  . TETANUS/TDAP  05/07/2024  . INFLUENZA VACCINE  Completed  . DEXA SCAN  Completed  . PNA vac Low Risk Adult  Completed  . HPV VACCINES  Aged Out       Assessment  This is a routine wellness examination for Tamara Gross.  Health Maintenance: Due or Overdue Health Maintenance Due  Topic Date Due  . COVID-19 Vaccine (1) Never done    Tamara Gross does not need a referral for Community Assistance: Care Management:   no Social Work:    no Prescription Assistance:  no Nutrition/Diabetes Education:  no   Plan:  Personalized Goals Goals Addressed              This Visit's Progress   .  Prevent falls (pt-stated)   On track     Personalized Health Maintenance & Screening Recommendations  Shingles vaccine  Lung Cancer Screening Recommended: no (Low Dose CT Chest recommended if Age 41-80 years, 30 pack-year currently smoking OR have quit w/in past 15 years) Hepatitis C Screening recommended: no HIV Screening recommended: no  Advanced Directives: Written information was not prepared per patient's request.  Referrals & Orders No orders of the defined types were placed in this encounter.   Follow-up Plan . Follow-up with Mechele Claude, MD as planned . Schedule 10/30/20 . Patient should be offered shingles vaccine. . Pt has had all 3 covid vaccines. . Pt lives alone and is still independent with all ADL's. She still drives and gets her exercise by climbing her apartment stairs daily. . NO difficulties with hearing or vision. . Pt declined information on advanced directives. . Pt had no concerns or questions  for this nurse today. . AVS printed and mailed to pt.   I have personally reviewed and noted the following in the patient's chart:   .  Medical and social history . Use of alcohol, tobacco or illicit drugs  . Current medications and supplements . Functional ability and status . Nutritional status . Physical activity . Advanced directives . List of other physicians . Hospitalizations, surgeries, and ER visits in previous 12 months . Vitals . Screenings to include cognitive, depression, and falls . Referrals and appointments  In addition, I have reviewed and discussed with Tamara Gross certain preventive protocols, quality metrics, and best practice recommendations. A written personalized care plan for preventive services as well as general preventive health recommendations is available and can be mailed to the patient at her request.      Conard Novak, LPN  10/01/4313

## 2020-07-15 ENCOUNTER — Other Ambulatory Visit: Payer: Self-pay | Admitting: Family Medicine

## 2020-07-15 DIAGNOSIS — M1000 Idiopathic gout, unspecified site: Secondary | ICD-10-CM

## 2020-07-15 DIAGNOSIS — I10 Essential (primary) hypertension: Secondary | ICD-10-CM

## 2020-10-21 ENCOUNTER — Other Ambulatory Visit: Payer: Self-pay | Admitting: Family Medicine

## 2020-10-21 DIAGNOSIS — I10 Essential (primary) hypertension: Secondary | ICD-10-CM

## 2020-10-30 ENCOUNTER — Ambulatory Visit (INDEPENDENT_AMBULATORY_CARE_PROVIDER_SITE_OTHER): Payer: Medicare Other

## 2020-10-30 ENCOUNTER — Encounter: Payer: Self-pay | Admitting: Family Medicine

## 2020-10-30 ENCOUNTER — Ambulatory Visit (INDEPENDENT_AMBULATORY_CARE_PROVIDER_SITE_OTHER): Payer: Medicare Other | Admitting: Family Medicine

## 2020-10-30 ENCOUNTER — Other Ambulatory Visit: Payer: Self-pay

## 2020-10-30 VITALS — BP 117/64 | HR 93 | Temp 98.1°F | Ht 61.0 in | Wt 98.2 lb

## 2020-10-30 DIAGNOSIS — M1611 Unilateral primary osteoarthritis, right hip: Secondary | ICD-10-CM | POA: Diagnosis not present

## 2020-10-30 DIAGNOSIS — M1000 Idiopathic gout, unspecified site: Secondary | ICD-10-CM | POA: Diagnosis not present

## 2020-10-30 DIAGNOSIS — Z23 Encounter for immunization: Secondary | ICD-10-CM | POA: Diagnosis not present

## 2020-10-30 DIAGNOSIS — M810 Age-related osteoporosis without current pathological fracture: Secondary | ICD-10-CM

## 2020-10-30 DIAGNOSIS — E782 Mixed hyperlipidemia: Secondary | ICD-10-CM | POA: Diagnosis not present

## 2020-10-30 DIAGNOSIS — E559 Vitamin D deficiency, unspecified: Secondary | ICD-10-CM

## 2020-10-30 DIAGNOSIS — D638 Anemia in other chronic diseases classified elsewhere: Secondary | ICD-10-CM | POA: Diagnosis not present

## 2020-10-30 DIAGNOSIS — I10 Essential (primary) hypertension: Secondary | ICD-10-CM | POA: Diagnosis not present

## 2020-10-30 DIAGNOSIS — M25551 Pain in right hip: Secondary | ICD-10-CM | POA: Diagnosis not present

## 2020-10-30 MED ORDER — NIFEDIPINE ER OSMOTIC RELEASE 60 MG PO TB24: 60.0000 mg | ORAL_TABLET | Freq: Every day | ORAL | 2 refills | Status: DC

## 2020-10-30 MED ORDER — ALLOPURINOL 100 MG PO TABS: 100.0000 mg | ORAL_TABLET | Freq: Every day | ORAL | 3 refills | Status: DC

## 2020-10-30 MED ORDER — CALCITONIN (SALMON) 200 UNIT/ACT NA SOLN
1.0000 | Freq: Every day | NASAL | 12 refills | Status: DC
Start: 2020-10-30 — End: 2021-10-28

## 2020-10-30 NOTE — Progress Notes (Signed)
Subjective:  Patient ID: Tamara Gross, female    DOB: [birth year]  Age: 80 y.o. MRN: [id number]  CC: Medical Management of Chronic Issues   HPI Tamara Gross presents for right leg pain when she walks. Bothers her if walking 1-2 blocks. Pain at anterior thigh into the right hip. Wonders if it is osteoporosis. She is using the fosamax weekly. 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   No gout flare for a long time.  She is concerned that she is having pain in the right hip based on her history of osteoporosis in the hip.  She has no known injury.   Follow-up of hypertension. Patient has no history of headache chest pain or shortness of breath or recent cough. Patient also denies symptoms of TIA such as numbness weakness lateralizing. Patient checks  blood pressure at home and has not had any elevated readings recently. Patient denies side effects from his medication. States taking it regularly. Patient in for follow-up of elevated cholesterol. Doing well without complaints on current medication. Denies side effects of statin including myalgia and arthralgia and nausea. Also in today for liver function testing. Currently no chest pain, shortness of breath or other cardiovascular related symptoms noted.  Patient has longstanding anemia of chronic disease.  She is due for a CBC today based on that.  She is also due to have her vitamin D rechecked due to past deficiency.  Depression screen [facility] 2/9 10/30/2020 07/08/2020 05/02/2020  Decreased Interest 0 0 0  Down, Depressed, Hopeless 0 0 0  PHQ - 2 Score 0 0 0    History Clairessa has a past medical history of Gout, Hypertension, and Osteoporosis.   She has a past surgical history that includes Tonsillectomy.   Her family history includes Cancer in her mother; Deep vein thrombosis in her father; Hypertension in her brother and sister.She reports that she has never smoked. She has never used smokeless tobacco. She reports that she does not drink  alcohol and does not use drugs.    ROS Review of Systems  Constitutional: Negative.   HENT:  Negative for congestion.   Eyes:  Negative for visual disturbance.  Respiratory:  Negative for shortness of breath.   Cardiovascular:  Negative for chest pain.  Gastrointestinal:  Negative for abdominal pain, constipation, diarrhea, nausea and vomiting.  Genitourinary:  Negative for difficulty urinating.  Musculoskeletal:  Positive for arthralgias (right hip pain dul chronic). Negative for myalgias.  Neurological:  Negative for headaches.  Psychiatric/Behavioral:  Negative for sleep disturbance.    Objective:  BP 117/64   Pulse 93   Temp 98.1 F (36.7 C)   Ht _0  (1.549 m)   Wt 98 lb 3.2 oz (44.5 kg)   SpO2 99%   BMI 18.55 kg/m   BP Readings from Last 3 Encounters:  10/30/20 117/64  05/02/20 125/81  10/30/19 135/76    Wt Readings from Last 3 Encounters:  10/30/20 98 lb 3.2 oz (44.5 kg)  05/02/20 104 lb 2 oz (47.2 kg)  10/30/19 103 lb 6 oz (46.9 kg)     Physical Exam Constitutional:      General: She is not in acute distress.    Appearance: She is well-developed.  HENT:     Head: Normocephalic and atraumatic.  Eyes:     Conjunctiva/sclera: Conjunctivae normal.     Pupils: Pupils are equal, round, and reactive to light.  Neck:     Thyroid: No thyromegaly.  Cardiovascular:  Rate and Rhythm: Normal rate and regular rhythm.     Heart sounds: Normal heart sounds. No murmur heard. Pulmonary:     Effort: Pulmonary effort is normal. No respiratory distress.     Breath sounds: Normal breath sounds. No wheezing or rales.  Abdominal:     General: Bowel sounds are normal. There is no distension.     Palpations: Abdomen is soft.     Tenderness: There is no abdominal tenderness.  Musculoskeletal:        General: Normal range of motion.     Cervical back: Normal range of motion and neck supple.  Lymphadenopathy:     Cervical: No cervical adenopathy.  Skin:    General:  Skin is warm and dry.  Neurological:     Mental Status: She is alert and oriented to person, place, and time.  Psychiatric:        Behavior: Behavior normal.        Thought Content: Thought content normal.        Judgment: Judgment normal.      Assessment & Plan:   Analuisa was seen today for medical management of chronic issues.  Diagnoses and all orders for this visit:  Primary hypertension -     CMP14+EGFR -     NIFEdipine (PROCARDIA XL/NIFEDICAL XL) 60 MG 24 hr tablet; Take 1 tablet (60 mg total) by mouth daily.  Vitamin D deficiency -     VITAMIN D 25 Hydroxy (Vit-D Deficiency, Fractures)  Anemia of chronic disease -     CBC with Differential/Platelet  Mixed hyperlipidemia -     Lipid panel  Idiopathic gout, unspecified chronicity, unspecified site -     allopurinol (ZYLOPRIM) 100 MG tablet; Take 1 tablet (100 mg total) by mouth daily. -     Uric acid  Right hip pain -     DG HIP UNILAT W OR W/O PELVIS 2-3 VIEWS RIGHT; Future -     Ambulatory referral to Physical Therapy  Need for shingles vaccine -     Varicella-zoster vaccine IM (Shingrix)  Age-related osteoporosis without current pathological fracture -     calcitonin, salmon, (MIACALCIN/FORTICAL) 200 UNIT/ACT nasal spray; Place 1 spray into alternate nostrils daily.      I have changed Emmeline B. Maniscalco's allopurinol and NIFEdipine. I am also having her start on calcitonin (salmon). Additionally, I am having her maintain her Vitamin D, multivitamin, lisinopril, and alendronate.  Allergies as of 10/30/2020   No Known Allergies      Medication List        Accurate as of October 30, 2020  3:04 PM. If you have any questions, ask your nurse or doctor.          alendronate 70 MG tablet Commonly known as: FOSAMAX Take 1 tablet (70 mg total) by mouth every 7 (seven) days. Take with a full glass of water on an empty stomach. Do not lay down for at least 2 hours   allopurinol 100 MG tablet Commonly known as:  ZYLOPRIM Take 1 tablet (100 mg total) by mouth daily.   calcitonin (salmon) 200 UNIT/ACT nasal spray Commonly known as: MIACALCIN/FORTICAL Place 1 spray into alternate nostrils daily. Started by: Claretta Fraise, MD   lisinopril 20 MG tablet Commonly known as: ZESTRIL Take 1 tablet (20 mg total) by mouth daily.   multivitamin tablet Take 1 tablet by mouth daily.   NIFEdipine 60 MG 24 hr tablet Commonly known as: PROCARDIA XL/NIFEDICAL XL Take 1 tablet (  60 mg total) by mouth daily.   Vitamin D 50 MCG (2000 UT) Caps Take 1 capsule by mouth.         Follow-up: Return in about 6 months (around 05/02/2021).  Claretta Fraise, M.D.

## 2020-10-31 LAB — CBC WITH DIFFERENTIAL/PLATELET
Basophils Absolute: 0 10*3/uL (ref 0.0–0.2)
Basos: 1 %
EOS (ABSOLUTE): 0.2 10*3/uL (ref 0.0–0.4)
Eos: 3 %
Hematocrit: 34 % (ref 34.0–46.6)
Hemoglobin: 11.2 g/dL (ref 11.1–15.9)
Immature Grans (Abs): 0 10*3/uL (ref 0.0–0.1)
Immature Granulocytes: 0 %
Lymphocytes Absolute: 1.5 10*3/uL (ref 0.7–3.1)
Lymphs: 25 %
MCH: 32.1 pg (ref 26.6–33.0)
MCHC: 32.9 g/dL (ref 31.5–35.7)
MCV: 97 fL (ref 79–97)
Monocytes Absolute: 0.5 10*3/uL (ref 0.1–0.9)
Monocytes: 9 %
Neutrophils Absolute: 3.9 10*3/uL (ref 1.4–7.0)
Neutrophils: 62 %
Platelets: 295 10*3/uL (ref 150–450)
RBC: 3.49 x10E6/uL — ABNORMAL LOW (ref 3.77–5.28)
RDW: 12.9 % (ref 11.7–15.4)
WBC: 6.2 10*3/uL (ref 3.4–10.8)

## 2020-10-31 LAB — CMP14+EGFR
ALT: 8 IU/L (ref 0–32)
AST: 14 IU/L (ref 0–40)
Albumin/Globulin Ratio: 1.9 (ref 1.2–2.2)
Albumin: 4.6 g/dL (ref 3.7–4.7)
Alkaline Phosphatase: 53 IU/L (ref 44–121)
BUN/Creatinine Ratio: 21 (ref 12–28)
BUN: 34 mg/dL — ABNORMAL HIGH (ref 8–27)
Bilirubin Total: 0.3 mg/dL (ref 0.0–1.2)
CO2: 18 mmol/L — ABNORMAL LOW (ref 20–29)
Calcium: 10 mg/dL (ref 8.7–10.3)
Chloride: 104 mmol/L (ref 96–106)
Creatinine, Ser: 1.59 mg/dL — ABNORMAL HIGH (ref 0.57–1.00)
Globulin, Total: 2.4 g/dL (ref 1.5–4.5)
Glucose: 95 mg/dL (ref 65–99)
Potassium: 5 mmol/L (ref 3.5–5.2)
Sodium: 136 mmol/L (ref 134–144)
Total Protein: 7 g/dL (ref 6.0–8.5)
eGFR: 33 mL/min/{1.73_m2} — ABNORMAL LOW (ref >59)

## 2020-10-31 LAB — LIPID PANEL
Chol/HDL Ratio: 3.5 ratio (ref 0.0–4.4)
Cholesterol, Total: 179 mg/dL (ref 100–199)
HDL: 51 mg/dL (ref >39)
LDL Chol Calc (NIH): 110 mg/dL — ABNORMAL HIGH (ref 0–99)
Triglycerides: 98 mg/dL (ref 0–149)
VLDL Cholesterol Cal: 18 mg/dL (ref 5–40)

## 2020-10-31 LAB — URIC ACID: Uric Acid: 5.8 mg/dL (ref 3.1–7.9)

## 2020-10-31 LAB — VITAMIN D 25 HYDROXY (VIT D DEFICIENCY, FRACTURES): Vit D, 25-Hydroxy: 35.9 ng/mL (ref 30.0–100.0)

## 2020-10-31 NOTE — Progress Notes (Signed)
Hello Sharlett,  Your lab result is normal and/or stable.Some minor variations that are not significant are commonly marked abnormal, but do not represent any medical problem for you.  Best regards, Sargent Mankey, M.D.

## 2020-11-06 ENCOUNTER — Other Ambulatory Visit: Payer: Self-pay | Admitting: Family Medicine

## 2020-11-06 DIAGNOSIS — I10 Essential (primary) hypertension: Secondary | ICD-10-CM

## 2020-11-11 ENCOUNTER — Other Ambulatory Visit: Payer: Self-pay

## 2020-11-11 ENCOUNTER — Ambulatory Visit: Payer: Medicare Other | Attending: Family Medicine | Admitting: Physical Therapy

## 2020-11-11 DIAGNOSIS — M25551 Pain in right hip: Secondary | ICD-10-CM | POA: Insufficient documentation

## 2020-11-11 DIAGNOSIS — M6281 Muscle weakness (generalized): Secondary | ICD-10-CM | POA: Diagnosis not present

## 2020-11-11 NOTE — Therapy (Signed)
[facility] [address] [place], [place], [address] Phone: [phone]   Fax:  [phone]  Physical Therapy Evaluation  Patient Details  Name: Tamara Gross MRN: [id number] Date of Birth: [birth year] Referring Provider (PT): Mechele Claude MD   Encounter Date: 11/11/2020   PT End of Session - 11/11/20 0932     Visit Number 1    Number of Visits 12    Date for PT Re-Evaluation 02/09/21    PT Start Time 0857    PT Stop Time 0919    PT Time Calculation (min) 22 min    Activity Tolerance Patient tolerated treatment well    Behavior During Therapy [facility] for tasks assessed/performed             Past Medical History:  Diagnosis Date   Gout    Hypertension    Osteoporosis     Past Surgical History:  Procedure Laterality Date   TONSILLECTOMY      There were no vitals filed for this visit.    Subjective Assessment - 11/11/20 0935     Subjective COVID-19 screen performed prior to patient entering clinic.  The patient presents to the clinc today with c/o right hip and thigh pain that increases after she walks awhile.  Her pain is rated at a 6/10 today.  Sitting decreases her pain.  X-rays reveal severe degenerative changes in her right hip.  She states this has been onogoing 'for quite sometime."  She states the pain will radiate into her right groin region.  She has no c/o back pain.    Pertinent History OP, HTN, GOUT.    Limitations Walking    How long can you sit comfortably? Unlimited.    How long can you walk comfortably? Few blocks.    Diagnostic tests X-ray.    Patient Stated Goals Walk without pain.    Currently in Pain? Yes    Pain Score 6     Pain Location Hip    Pain Orientation Right    Pain Descriptors / Indicators Sharp;Aching    Pain Type Acute pain    Pain Onset More than a month ago    Pain Frequency Intermittent    Aggravating Factors  See above.    Pain Relieving Factors See above.                 [facility] PT Assessment - 11/11/20 0001       Assessment   Medical Diagnosis Right hip pain.    Referring Provider (PT) Mechele Claude MD    Onset Date/Surgical Date --   "For quite awhile."     Precautions   Precautions None      Restrictions   Weight Bearing Restrictions No      Balance Screen   Has the patient fallen in the past 6 months No    Has the patient had a decrease in activity level because of a fear of falling?  No    Is the patient reluctant to leave their home because of a fear of falling?  No      Home Environment   Living Environment Private residence      Prior Function   Level of Independence Independent      Posture/Postural Control   Posture Comments Genu valgum and tibial ER right > left.      Deep Tendon Reflexes   DTR Assessment Site Patella;Achilles    Patella DTR 2+    Achilles DTR  0      ROM / Strength   AROM / PROM / Strength AROM;Strength      AROM   Overall AROM Comments Assessed in supine, patient's right hip range of motion is WF/NL.      Strength   Overall Strength Comments Right hip flexion and abduction is 4-/5.      Palpation   Palpation comment No palpable right hip pain.      Ambulation/Gait   Gait Comments The patient walks in a mild trendelenburg type gait pattern with right knee in valgum.                        Objective measurements completed on examination: See above findings.                    PT Long Term Goals - 11/11/20 1021       PT LONG TERM GOAL #1   Title Independent with an HEP.    Time 6    Period Weeks    Status New      PT LONG TERM GOAL #2   Title Right hip strength to 4+/5 to increase stability for functional tasks.    Time 6    Period Weeks    Status New      PT LONG TERM GOAL #3   Title Walk a community distance with right hip pain not > 3-4/10.    Time 6    Period Weeks    Status New                    Plan - 11/11/20 0955      Clinical Impression Statement The patient presents to OPPT with c/o right hip pain.  Her hip is notably weak.  Her range of motion is WF/NL.  Her pain increases with walking and she experinences pain into her fight groin and anterior thigh.  Her gait is remarkable for a Trendelenburg type gait pattern and her right knee is in valgum.  She has no palpable pain.Patient will benefit from skilled physical therapy intervention to address pain and deficits.    Personal Factors and Comorbidities Comorbidity 1;Other    Comorbidities OP, HTN, GOUT.    Examination-Activity Limitations Locomotion Level;Other    Examination-Participation Restrictions Other    Stability/Clinical Decision Making Evolving/Moderate complexity    Clinical Decision Making Low    Rehab Potential Good    PT Frequency 2x / week    PT Duration 6 weeks    PT Treatment/Interventions ADLs/Self Care Home Management;Electrical Stimulation;Cryotherapy;Moist Heat;Ultrasound;Neuromuscular re-education;Therapeutic exercise;Therapeutic activities;Functional mobility training;Stair training;Gait training;Manual techniques    PT Next Visit Plan Right hip strengthening to include "clamshell" exercise.  Nustep.  GT.    Consulted and Agree with Plan of Care Patient             Patient will benefit from skilled therapeutic intervention in order to improve the following deficits and impairments:  Abnormal gait, Decreased activity tolerance, Decreased strength, Difficulty walking, Postural dysfunction, Pain  Visit Diagnosis: Pain in right hip - Plan: PT plan of care cert/re-cert  Muscle weakness (generalized) - Plan: PT plan of care cert/re-cert     Problem List Patient Active Problem List   Diagnosis Date Noted   Anemia of chronic disease 11/06/2015   Vitamin D deficiency 07/05/2013   Postmenopausal 09/16/2012   Facial nerve palsy 09/16/2012   Osteoporosis 09/16/2012   CKD (chronic kidney disease) 09/16/2012  Gout 09/16/2012    Vitiligo 09/16/2012   HTN (hypertension) 09/16/2012    Tamara Gross, [place] MPT 11/11/2020, 10:24 AM  [facility] [address] [place], [place], [address] Phone: [phone]   Fax:  [phone]  Name: Tamara Gross MRN: [id number] Date of Birth: 1940-10-25

## 2020-11-15 ENCOUNTER — Ambulatory Visit: Payer: Medicare Other | Admitting: Physical Therapy

## 2020-11-18 ENCOUNTER — Other Ambulatory Visit: Payer: Self-pay

## 2020-11-18 ENCOUNTER — Encounter: Payer: Self-pay | Admitting: Physical Therapy

## 2020-11-18 ENCOUNTER — Ambulatory Visit: Payer: Medicare Other | Attending: Family Medicine | Admitting: Physical Therapy

## 2020-11-18 DIAGNOSIS — M6281 Muscle weakness (generalized): Secondary | ICD-10-CM

## 2020-11-18 DIAGNOSIS — M25551 Pain in right hip: Secondary | ICD-10-CM | POA: Diagnosis not present

## 2020-11-18 NOTE — Therapy (Signed)
[facility] [address] [place], [place], [address] Phone: [phone]   Fax:  [phone]  Physical Therapy Treatment  Patient Details  Name: Tamara Gross MRN: [id number] Date of Birth: Apr 17, 1941 Referring Provider (PT): Mechele Claude MD   Encounter Date: 11/18/2020   PT End of Session - 11/18/20 0955     Visit Number 2    Number of Visits 12    Date for PT Re-Evaluation 02/09/21    PT Start Time 0948    PT Stop Time 1027    PT Time Calculation (min) 39 min    Activity Tolerance Patient tolerated treatment well    Behavior During Therapy [facility] for tasks assessed/performed             Past Medical History:  Diagnosis Date   Gout    Hypertension    Osteoporosis     Past Surgical History:  Procedure Laterality Date   TONSILLECTOMY      There were no vitals filed for this visit.   Subjective Assessment - 11/18/20 0949     Subjective COVID-19 screen performed prior to patient entering clinic. Reports that    Pertinent History OP, HTN, GOUT.    Limitations Walking    How long can you sit comfortably? Unlimited.    How long can you walk comfortably? Few blocks.    Diagnostic tests X-ray.    Patient Stated Goals Walk without pain.    Currently in Pain? No/denies    Pain Location --    Pain Orientation --                [facility] PT Assessment - 11/18/20 0001       Assessment   Medical Diagnosis Right hip pain.    Referring Provider (PT) Mechele Claude MD      Precautions   Precautions None      Restrictions   Weight Bearing Restrictions No                           [facility] Adult PT Treatment/Exercise - 11/18/20 0001       Exercises   Exercises Knee/Hip      Knee/Hip Exercises: Aerobic   Nustep L2 x12 min      Knee/Hip Exercises: Standing   Heel Raises Both;20 reps   with glute squeeze   Heel Raises Limitations B toe raise x20 reps    Knee Flexion AROM;Both;2 sets;10 reps    Hip  Flexion AROM;Both;2 sets;10 reps    Hip Abduction AROM;Both;2 sets;10 reps;Knee straight    Functional Squat 5 reps;Limitations    Functional Squat Limitations limited via technique      Knee/Hip Exercises: Seated   Long Arc Quad Strengthening;Both;2 sets;15 reps    Long Arc Quad Weight 2 lbs.    Clamshell with TheraBand Yellow   x20 reps   Marching Strengthening;Both;2 sets;10 reps;Limitations    Marching Limitations yellow theraband    Hamstring Curl Strengthening;Both;2 sets;10 reps    Hamstring Limitations yellow theraband    Abduction/Adduction  Strengthening;Both;2 sets;10 reps    Abd/Adduction Limitations yellow theraband    Sit to Sand 2 sets;15 reps;with UE support   one set without theraband, one set with theraband                        PT Long Term Goals - 11/18/20 1040       PT LONG  TERM GOAL #1   Title Independent with an HEP.    Time 6    Period Weeks    Status Achieved      PT LONG TERM GOAL #2   Title Right hip strength to 4+/5 to increase stability for functional tasks.    Time 6    Period Weeks    Status On-going      PT LONG TERM GOAL #3   Title Walk a community distance with right hip pain not > 3-4/10.    Time 6    Period Weeks    Status On-going                   Plan - 11/18/20 1036     Clinical Impression Statement Patient presented in clinic with no complaints of pain. Patient reports compliance with HEP but states that she used to be very active with travel and with going to local rec center which has stopped due to COVID. Patient also reported that she used to walk recreationally but reported pain and she has not returned to walking at this time. Patient able to tolerate all light AROM and strengthening therex with no complaints of joint pain. Patient reported LE fatigued beginning at end of session.    Personal Factors and Comorbidities Comorbidity 1;Other    Comorbidities OP, HTN, GOUT.    Examination-Activity  Limitations Locomotion Level;Other    Examination-Participation Restrictions Other    Stability/Clinical Decision Making Evolving/Moderate complexity    Rehab Potential Good    PT Frequency 2x / week    PT Duration 6 weeks    PT Treatment/Interventions ADLs/Self Care Home Management;Electrical Stimulation;Cryotherapy;Moist Heat;Ultrasound;Neuromuscular re-education;Therapeutic exercise;Therapeutic activities;Functional mobility training;Stair training;Gait training;Manual techniques    PT Next Visit Plan Right hip strengthening to include "clamshell" exercise.  Nustep.  GT.    Consulted and Agree with Plan of Care Patient             Patient will benefit from skilled therapeutic intervention in order to improve the following deficits and impairments:  Abnormal gait, Decreased activity tolerance, Decreased strength, Difficulty walking, Postural dysfunction, Pain  Visit Diagnosis: Pain in right hip  Muscle weakness (generalized)     Problem List Patient Active Problem List   Diagnosis Date Noted   Anemia of chronic disease 11/06/2015   Vitamin D deficiency 07/05/2013   Postmenopausal 09/16/2012   Facial nerve palsy 09/16/2012   Osteoporosis 09/16/2012   CKD (chronic kidney disease) 09/16/2012   Gout 09/16/2012   Vitiligo 09/16/2012   HTN (hypertension) 09/16/2012    [name], PTA 11/18/2020, 10:41 AM  [facility] [address] [place], [place], [address] Phone: [phone]   Fax:  [phone]  Name: Tamara Gross MRN: [id number] Date of Birth: 08/21/1940

## 2020-11-19 ENCOUNTER — Encounter: Payer: Self-pay | Admitting: Gastroenterology

## 2020-11-20 ENCOUNTER — Other Ambulatory Visit: Payer: Self-pay

## 2020-11-20 ENCOUNTER — Ambulatory Visit: Payer: Medicare Other | Admitting: Physical Therapy

## 2020-11-20 ENCOUNTER — Encounter: Payer: Self-pay | Admitting: Physical Therapy

## 2020-11-20 DIAGNOSIS — M6281 Muscle weakness (generalized): Secondary | ICD-10-CM | POA: Diagnosis not present

## 2020-11-20 DIAGNOSIS — M25551 Pain in right hip: Secondary | ICD-10-CM

## 2020-11-20 NOTE — Therapy (Signed)
[facility] [address] [place], [place], [address] Phone: [phone]   Fax:  [phone]  Physical Therapy Treatment  Patient Details  Name: Tamara Gross Date MRN: [id number] Date of Birth: [birth year] Referring Provider (PT): Mechele Claude MD   Encounter Date: 11/20/2020   PT End of Session - 11/20/20 0950     Visit Number 3    Number of Visits 12    Date for PT Re-Evaluation 02/09/21    PT Start Time 0944    PT Stop Time 1026    PT Time Calculation (min) 42 min    Activity Tolerance Patient tolerated treatment well    Behavior During Therapy [facility] for tasks assessed/performed             Past Medical History:  Diagnosis Date   Gout    Hypertension    Osteoporosis     Past Surgical History:  Procedure Laterality Date   TONSILLECTOMY      There were no vitals filed for this visit.   Subjective Assessment - 11/20/20 0950     Subjective COVID-19 screen performed prior to patient entering clinic. Reports that improvements have been noted.    Pertinent History OP, HTN, GOUT.    Limitations Walking    How long can you sit comfortably? Unlimited.    How long can you walk comfortably? Few blocks.    Diagnostic tests X-ray.    Patient Stated Goals Walk without pain.    Currently in Pain? No/denies                [facility] PT Assessment - 11/20/20 0001       Assessment   Medical Diagnosis Right hip pain.    Referring Provider (PT) Mechele Claude MD      Precautions   Precautions None                           [facility] Adult PT Treatment/Exercise - 11/20/20 0001       Knee/Hip Exercises: Aerobic   Nustep L3 x15 min      Knee/Hip Exercises: Standing   Heel Raises Both;20 reps    Heel Raises Limitations Gross toe raise x20 reps    Knee Flexion Strengthening;Both;2 sets;10 reps;Limitations    Knee Flexion Limitations 3#    Hip Flexion AROM;Both;2 sets;10 reps    Hip Abduction AROM;Both;2 sets;10  reps;Knee straight    Other Standing Knee Exercises sidestepping yellow theraband x3 RT // bars      Knee/Hip Exercises: Seated   Long Arc Quad Strengthening;Both;2 sets;15 reps    Long Arc Quad Weight 3 lbs.    Ball Squeeze x20 reps    Clamshell with TheraBand Yellow   x2 0reps   Marching Strengthening;Both;2 sets;10 reps;Limitations    Marching Limitations yellow theraband    Hamstring Curl Strengthening;Both;2 sets;10 reps    Hamstring Limitations yellow theraband    Sit to Sand 15 reps;without UE support                         PT Long Term Goals - 11/18/20 1040       PT LONG TERM GOAL #1   Title Independent with an HEP.    Time 6    Period Weeks    Status Achieved      PT LONG TERM GOAL #2   Title Right hip strength to 4+/5 to increase stability for  functional tasks.    Time 6    Period Weeks    Status On-going      PT LONG TERM GOAL #3   Title Walk a community distance with right hip pain not > 3-4/10.    Time 6    Period Weeks    Status On-going                   Plan - 11/20/20 1036     Clinical Impression Statement Patient presented in clinic with reports of some soreness but no pain. Patient has noticed her hip has felt some better. Patient progressed to more standing and resisted exercises throughout. No obvious compensatory strategies noted during therex session. No complaints of pain following end of session.    Personal Factors and Comorbidities Comorbidity 1;Other    Comorbidities OP, HTN, GOUT.    Examination-Activity Limitations Locomotion Level;Other    Examination-Participation Restrictions Other    Stability/Clinical Decision Making Evolving/Moderate complexity    Rehab Potential Good    PT Frequency 2x / week    PT Duration 6 weeks    PT Treatment/Interventions ADLs/Self Care Home Management;Electrical Stimulation;Cryotherapy;Moist Heat;Ultrasound;Neuromuscular re-education;Therapeutic exercise;Therapeutic  activities;Functional mobility training;Stair training;Gait training;Manual techniques    PT Next Visit Plan Right hip strengthening to include "clamshell" exercise.  Nustep.  GT.    Consulted and Agree with Plan of Care Patient             Patient will benefit from skilled therapeutic intervention in order to improve the following deficits and impairments:  Abnormal gait, Decreased activity tolerance, Decreased strength, Difficulty walking, Postural dysfunction, Pain  Visit Diagnosis: Pain in right hip  Muscle weakness (generalized)     Problem List Patient Active Problem List   Diagnosis Date Noted   Anemia of chronic disease 11/06/2015   Vitamin D deficiency 07/05/2013   Postmenopausal 09/16/2012   Facial nerve palsy 09/16/2012   Osteoporosis 09/16/2012   CKD (chronic kidney disease) 09/16/2012   Gout 09/16/2012   Vitiligo 09/16/2012   HTN (hypertension) 09/16/2012    [name], PTA 11/20/2020, 10:39 AM  [facility] [address] [place], [place], [address] Phone: [phone]   Fax:  [phone]  Name: Tamara Gross MRN: [id number] Date of Birth: Nov 08, 1940

## 2020-11-25 ENCOUNTER — Encounter: Payer: Medicare Other | Admitting: Physical Therapy

## 2020-11-26 ENCOUNTER — Encounter: Payer: Self-pay | Admitting: *Deleted

## 2020-11-26 ENCOUNTER — Other Ambulatory Visit: Payer: Self-pay

## 2020-11-26 ENCOUNTER — Ambulatory Visit: Payer: Medicare Other | Admitting: *Deleted

## 2020-11-26 DIAGNOSIS — M6281 Muscle weakness (generalized): Secondary | ICD-10-CM | POA: Diagnosis not present

## 2020-11-26 DIAGNOSIS — M25551 Pain in right hip: Secondary | ICD-10-CM | POA: Diagnosis not present

## 2020-11-26 NOTE — Therapy (Signed)
[facility] [address] [place], [place], [address] Phone: [phone]   Fax:  [phone]  Physical Therapy Treatment  Patient Details  Name: Tamara Gross MRN: [id number] Date of Birth: 13-Jan-1941 Referring Provider (PT): Mechele Claude MD   Encounter Date: 11/26/2020   PT End of Session - 11/26/20 0912     Visit Number 4    Number of Visits 12    Date for PT Re-Evaluation 02/09/21    PT Start Time 0900    PT Stop Time 0946    PT Time Calculation (min) 46 min             Past Medical History:  Diagnosis Date   Gout    Hypertension    Osteoporosis     Past Surgical History:  Procedure Laterality Date   TONSILLECTOMY      There were no vitals filed for this visit.   Subjective Assessment - 11/26/20 0911     Subjective COVID-19 screen performed prior to patient entering clinic. Minimal pain RT hip today. ADLs getting easier    Pertinent History OP, HTN, GOUT.    Limitations Walking    How long can you sit comfortably? Unlimited.    How long can you walk comfortably? Few blocks.    Currently in Pain? No/denies    Pain Location Hip                               [facility] Adult PT Treatment/Exercise - 11/26/20 0001       Exercises   Exercises Knee/Hip      Knee/Hip Exercises: Aerobic   Nustep L3 x15 min      Knee/Hip Exercises: Standing   Heel Raises Both;20 reps    Heel Raises Limitations B toe raise x20 reps    Knee Flexion Both;2 sets;10 reps;Limitations;AROM    Hip Flexion AROM;Both;2 sets;20 reps    Hip Abduction AROM;Both;2 sets;10 reps;Knee straight    Rocker Board 3 minutes   DF/PF balance   Other Standing Knee Exercises sidestepping yellow theraband x3 RT // bars      Knee/Hip Exercises: Seated   Long Arc Quad Strengthening;Both;2 sets;15 reps    Long Arc Quad Weight 3 lbs.    Ball Squeeze x 10 hold 10 secs    Clamshell with TheraBand Yellow   2x10 hold 5 secs   Marching --     Marching Limitations --    Hamstring Curl Strengthening;Both;2 sets;10 reps    Hamstring Limitations yellow theraband    Sit to Sand 15 reps;without UE support                         PT Long Term Goals - 11/18/20 1040       PT LONG TERM GOAL #1   Title Independent with an HEP.    Time 6    Period Weeks    Status Achieved      PT LONG TERM GOAL #2   Title Right hip strength to 4+/5 to increase stability for functional tasks.    Time 6    Period Weeks    Status On-going      PT LONG TERM GOAL #3   Title Walk a community distance with right hip pain not > 3-4/10.    Time 6    Period Weeks    Status On-going  Plan - 11/26/20 0937     Clinical Impression Statement Pt arrived to PT today and reports that her hip is doing much better and ADL's are easier with less pain now. Rx focused on OKC and CKC exs for strengthening as well as balance exs.Mainly fatigued end of RX    Personal Factors and Comorbidities Comorbidity 1;Other    Comorbidities OP, HTN, GOUT.    Examination-Participation Restrictions Other    Stability/Clinical Decision Making Evolving/Moderate complexity    Rehab Potential Good    PT Frequency 2x / week    PT Duration 6 weeks    PT Treatment/Interventions ADLs/Self Care Home Management;Electrical Stimulation;Cryotherapy;Moist Heat;Ultrasound;Neuromuscular re-education;Therapeutic exercise;Therapeutic activities;Functional mobility training;Stair training;Gait training;Manual techniques    PT Next Visit Plan Right hip strengthening to include "clamshell" exercise.  Nustep.  GT.    Consulted and Agree with Plan of Care Patient             Patient will benefit from skilled therapeutic intervention in order to improve the following deficits and impairments:  Abnormal gait, Decreased activity tolerance, Decreased strength, Difficulty walking, Postural dysfunction, Pain  Visit Diagnosis: Pain in right hip  Muscle  weakness (generalized)     Problem List Patient Active Problem List   Diagnosis Date Noted   Anemia of chronic disease 11/06/2015   Vitamin D deficiency 07/05/2013   Postmenopausal 09/16/2012   Facial nerve palsy 09/16/2012   Osteoporosis 09/16/2012   CKD (chronic kidney disease) 09/16/2012   Gout 09/16/2012   Vitiligo 09/16/2012   HTN (hypertension) 09/16/2012    Leahanna Buser,[name], PTA 11/26/2020, 9:46 AM  [facility] [address] [place], [place], [address] Phone: [phone]   Fax:  [phone]  Name: Tamara Gross MRN: [id number] Date of Birth: July 15, 1940

## 2020-11-29 ENCOUNTER — Ambulatory Visit: Payer: Medicare Other | Admitting: *Deleted

## 2020-11-29 ENCOUNTER — Encounter: Payer: Self-pay | Admitting: *Deleted

## 2020-11-29 ENCOUNTER — Other Ambulatory Visit: Payer: Self-pay

## 2020-11-29 DIAGNOSIS — M6281 Muscle weakness (generalized): Secondary | ICD-10-CM | POA: Diagnosis not present

## 2020-11-29 DIAGNOSIS — M25551 Pain in right hip: Secondary | ICD-10-CM

## 2020-11-29 NOTE — Therapy (Signed)
[facility] [address] [place], [place], [address] Phone: [phone]   Fax:  [phone]  Physical Therapy Treatment  Patient Details  Name: Tamara Gross MRN: [id number] Date of Birth: 1940-06-17 Referring Provider (PT): Tamara Claude MD   Encounter Date: 11/29/2020   PT End of Session - 11/29/20 0959     Visit Number 5    Number of Visits 12    Date for PT Re-Evaluation 02/09/21    PT Start Time 0945    PT Stop Time 1034    PT Time Calculation (min) 49 min             Past Medical History:  Diagnosis Date   Gout    Hypertension    Osteoporosis     Past Surgical History:  Procedure Laterality Date   TONSILLECTOMY      There were no vitals filed for this visit.                      [facility] Adult PT Treatment/Exercise - 11/29/20 0001       Knee/Hip Exercises: Aerobic   Nustep L3 x15 min      Knee/Hip Exercises: Standing   Heel Raises Both;20 reps    Heel Raises Limitations B toe raise x20 reps    Knee Flexion Both;10 reps;Limitations;AROM;3 sets    Hip Flexion AROM;Both;20 reps;3 sets;10 reps    Hip Abduction AROM;Both;10 reps;Knee straight;3 sets    Forward Step Up Both;1 set;20 reps;Step Height: 4";Hand Hold: 2    Rocker Board 3 minutes   DF/PF balance   Other Standing Knee Exercises sidestepping yellow theraband x 5 laps // bars      Knee/Hip Exercises: Seated   Long Arc Quad Strengthening;Both;2 sets;15 reps    Long Arc Quad Weight 3 lbs.    Ball Squeeze 2 x 10 hold 10 secs    Clamshell with TheraBand Yellow   2x10 hold 5 secs, Try red band   Hamstring Curl Strengthening;Both;2 sets;10 reps    Hamstring Limitations yellow theraband    Sit to Sand 15 reps;without UE support                         PT Long Term Goals - 11/18/20 1040       PT LONG TERM GOAL #1   Title Independent with an HEP.    Time 6    Period Weeks    Status Achieved      PT LONG TERM GOAL  #2   Title Right hip strength to 4+/5 to increase stability for functional tasks.    Time 6    Period Weeks    Status On-going      PT LONG TERM GOAL #3   Title Walk a community distance with right hip pain not > 3-4/10.    Time 6    Period Weeks    Status On-going                   Plan - 11/29/20 1034     Clinical Impression Statement Pt arrived today doing great and continues to progress with Exs and Act's  since starting PT. Pt had mild hip pain during Rx 2/10 today. Pt still needs SBA with balance exs and cueing for technique for Exs. Pt reports sit to stand getting much easier.    Personal Factors and Comorbidities Comorbidity 1;Other    Comorbidities OP, HTN,  GOUT.    Examination-Activity Limitations Locomotion Level;Other    Stability/Clinical Decision Making Evolving/Moderate complexity    Rehab Potential Good    PT Frequency 2x / week    PT Duration 6 weeks    PT Treatment/Interventions ADLs/Self Care Home Management;Electrical Stimulation;Cryotherapy;Moist Heat;Ultrasound;Neuromuscular re-education;Therapeutic exercise;Therapeutic activities;Functional mobility training;Stair training;Gait training;Manual techniques    PT Next Visit Plan Right hip strengthening to include "clamshell" exercise.  Nustep.  GT.    Consulted and Agree with Plan of Care Patient             Patient will benefit from skilled therapeutic intervention in order to improve the following deficits and impairments:  Abnormal gait, Decreased activity tolerance, Decreased strength, Difficulty walking, Postural dysfunction, Pain  Visit Diagnosis: Pain in right hip  Muscle weakness (generalized)     Problem List Patient Active Problem List   Diagnosis Date Noted   Anemia of chronic disease 11/06/2015   Vitamin D deficiency 07/05/2013   Postmenopausal 09/16/2012   Facial nerve palsy 09/16/2012   Osteoporosis 09/16/2012   CKD (chronic kidney disease) 09/16/2012   Gout 09/16/2012    Vitiligo 09/16/2012   HTN (hypertension) 09/16/2012    Tamara Gross,Tamara Gross, PTA 11/29/2020, 10:42 AM  Tamara Gross [address] [place], [place], [address] Phone: [phone]   Fax:  [phone]  Name: Tamara Gross MRN: [id number] Date of Birth: [birth year]

## 2020-12-02 ENCOUNTER — Other Ambulatory Visit: Payer: Self-pay

## 2020-12-02 ENCOUNTER — Ambulatory Visit: Payer: Medicare Other | Admitting: Physical Therapy

## 2020-12-02 ENCOUNTER — Encounter: Payer: Self-pay | Admitting: Physical Therapy

## 2020-12-02 DIAGNOSIS — M25551 Pain in right hip: Secondary | ICD-10-CM | POA: Diagnosis not present

## 2020-12-02 DIAGNOSIS — M6281 Muscle weakness (generalized): Secondary | ICD-10-CM | POA: Diagnosis not present

## 2020-12-02 NOTE — Therapy (Signed)
[facility] [address] [place], [place], [address] Phone: [phone]   Fax:  [phone]  Physical Therapy Treatment  Patient Details  Name: Tamara [facility] MRN: [id number] Date of Birth: December 29, 1940 Referring Provider (PT): Mechele Claude MD   Encounter Date: 12/02/2020   PT End of Session - 12/02/20 1201     Visit Number 6    Number of Visits 12    Date for PT Re-Evaluation 02/09/21    PT Start Time 0946    PT Stop Time 1028    PT Time Calculation (min) 42 min    Activity Tolerance Patient tolerated treatment well    Behavior During Therapy [facility] for tasks assessed/performed             Past Medical History:  Diagnosis Date   Gout    Hypertension    Osteoporosis     Past Surgical History:  Procedure Laterality Date   TONSILLECTOMY      There were no vitals filed for this visit.   Subjective Assessment - 12/02/20 1200     Subjective No pain today.    Pertinent History OP, HTN, GOUT.    Limitations Walking    How long can you sit comfortably? Unlimited.    How long can you walk comfortably? Few blocks.    Diagnostic tests X-ray.    Patient Stated Goals Walk without pain.    Currently in Pain? No/denies                [facility] PT Assessment - 12/02/20 0001       Assessment   Medical Diagnosis Right hip pain.    Referring Provider (PT) Mechele Claude MD      Precautions   Precautions None                           [facility] Adult PT Treatment/Exercise - 12/02/20 0001       Knee/Hip Exercises: Aerobic   Nustep L4 x15 min      Knee/Hip Exercises: Standing   Heel Raises Both;20 reps   with glute squeeze   Heel Raises Limitations B toe raise x20 reps    Hip Flexion AROM;Both;20 reps;Knee bent    Hip Abduction AROM;Both;20 reps;Knee straight    Hip Extension AROM;Both;2 sets;10 reps;Knee bent    Other Standing Knee Exercises sidestepping red theraband x3 laps // bars      Knee/Hip  Exercises: Seated   Long Arc Quad Strengthening;Both;2 sets;15 reps    Long Arc Quad Weight 4 lbs.    Clamshell with TheraBand Red   x30 reps   Marching Strengthening;Both;2 sets;10 reps;Limitations    Marching Limitations 4#    Hamstring Curl Strengthening;Both;2 sets;10 reps    Hamstring Limitations red theraband    Sit to Sand 20 reps;without UE support   red theraband                        PT Long Term Goals - 11/18/20 1040       PT LONG TERM GOAL #1   Title Independent with an HEP.    Time 6    Period Weeks    Status Achieved      PT LONG TERM GOAL #2   Title Right hip strength to 4+/5 to increase stability for functional tasks.    Time 6    Period Weeks    Status On-going  PT LONG TERM GOAL #3   Title Walk a community distance with right hip pain not > 3-4/10.    Time 6    Period Weeks    Status On-going                   Plan - 12/02/20 1210     Clinical Impression Statement Patient presented in clinic with no reports of pain. Patient progressed with resistance and only complaints of fatigue following end of session.    Personal Factors and Comorbidities Comorbidity 1;Other    Comorbidities OP, HTN, GOUT.    Examination-Activity Limitations Locomotion Level;Other    Examination-Participation Restrictions Other    Stability/Clinical Decision Making Evolving/Moderate complexity    Rehab Potential Good    PT Frequency 2x / week    PT Duration 6 weeks    PT Treatment/Interventions ADLs/Self Care Home Management;Electrical Stimulation;Cryotherapy;Moist Heat;Ultrasound;Neuromuscular re-education;Therapeutic exercise;Therapeutic activities;Functional mobility training;Stair training;Gait training;Manual techniques    PT Next Visit Plan Right hip strengthening to include "clamshell" exercise.  Nustep.  GT.    Consulted and Agree with Plan of Care Patient             Patient will benefit from skilled therapeutic intervention in order  to improve the following deficits and impairments:  Abnormal gait, Decreased activity tolerance, Decreased strength, Difficulty walking, Postural dysfunction, Pain  Visit Diagnosis: Pain in right hip  Muscle weakness (generalized)     Problem List Patient Active Problem List   Diagnosis Date Noted   Anemia of chronic disease 11/06/2015   Vitamin D deficiency 07/05/2013   Postmenopausal 09/16/2012   Facial nerve palsy 09/16/2012   Osteoporosis 09/16/2012   CKD (chronic kidney disease) 09/16/2012   Gout 09/16/2012   Vitiligo 09/16/2012   HTN (hypertension) 09/16/2012    [name], PTA 12/02/2020, 12:12 PM  [facility] [address] [place], [place], [address] Phone: [phone]   Fax:  [phone]  Name: Tamara [facility] MRN: [id number] Date of Birth: 01-Mar-1941

## 2020-12-06 ENCOUNTER — Other Ambulatory Visit: Payer: Self-pay

## 2020-12-06 ENCOUNTER — Ambulatory Visit: Payer: Medicare Other | Admitting: *Deleted

## 2020-12-06 DIAGNOSIS — M25551 Pain in right hip: Secondary | ICD-10-CM | POA: Diagnosis not present

## 2020-12-06 DIAGNOSIS — M6281 Muscle weakness (generalized): Secondary | ICD-10-CM

## 2020-12-06 NOTE — Therapy (Signed)
[facility] [address] [place], [place], [address] Phone: [phone]   Fax:  [phone]  Physical Therapy Treatment  Patient Details  Name: Tamara [facility] MRN: [id number] Date of Birth: 1940-05-24 Referring Provider (PT): Mechele Claude MD   Encounter Date: 12/06/2020   PT End of Session - 12/06/20 1008     Visit Number 7    Number of Visits 12    Date for PT Re-Evaluation 02/09/21    PT Start Time 0945    PT Stop Time 1032    PT Time Calculation (min) 47 min             Past Medical History:  Diagnosis Date   Gout    Hypertension    Osteoporosis     Past Surgical History:  Procedure Laterality Date   TONSILLECTOMY      There were no vitals filed for this visit.   Subjective Assessment - 12/06/20 0946     Subjective Covid-19 screening performed prior to entering clinic.Marland Kitchen Pt arrived today doing better with decreased hip pain    Pertinent History OP, HTN, GOUT.    Limitations Walking    How long can you sit comfortably? Unlimited.    Patient Stated Goals Walk without pain.    Currently in Pain? No/denies    Pain Location Hip    Pain Orientation Right    Pain Descriptors / Indicators Sharp;Aching    Pain Type Acute pain    Pain Onset More than a month ago                               [facility] Adult PT Treatment/Exercise - 12/06/20 0001       Exercises   Exercises Knee/Hip      Knee/Hip Exercises: Aerobic   Nustep L4 x15 min      Knee/Hip Exercises: Standing   Heel Raises Both;20 reps   with glute squeeze   Heel Raises Limitations B toe raise x20 reps    Hip Flexion AROM;Both;20 reps;Knee bent    Hip Abduction AROM;Both;Knee straight;3 sets;10 reps    Forward Step Up Both;1 set;20 reps;Step Height: 4";Hand Hold: 2    Rocker Board 3 minutes   DF/PF balance   Other Standing Knee Exercises sidestepping red theraband x3 laps // bars      Knee/Hip Exercises: Seated   Long Arc Quad  Strengthening;Both;2 sets;15 reps    Long Arc Quad Weight 4 lbs.    Ball Squeeze 2 x 10 hold 10 secs    Clamshell with TheraBand Red   x30 reps   Sit to Sand 20 reps;without UE support   red theraband                        PT Long Term Goals - 11/18/20 1040       PT LONG TERM GOAL #1   Title Independent with an HEP.    Time 6    Period Weeks    Status Achieved      PT LONG TERM GOAL #2   Title Right hip strength to 4+/5 to increase stability for functional tasks.    Time 6    Period Weeks    Status On-going      PT LONG TERM GOAL #3   Title Walk a community distance with right hip pain not > 3-4/10.    Time 6  Period Weeks    Status On-going                    Patient will benefit from skilled therapeutic intervention in order to improve the following deficits and impairments:     Visit Diagnosis: Pain in right hip  Muscle weakness (generalized)     Problem List Patient Active Problem List   Diagnosis Date Noted   Anemia of chronic disease 11/06/2015   Vitamin D deficiency 07/05/2013   Postmenopausal 09/16/2012   Facial nerve palsy 09/16/2012   Osteoporosis 09/16/2012   CKD (chronic kidney disease) 09/16/2012   Gout 09/16/2012   Vitiligo 09/16/2012   HTN (hypertension) 09/16/2012    Mykenzi Vanzile,[name], PTA 12/06/2020, 12:23 PM  [facility] [address] [place], [place], [address] Phone: [phone]   Fax:  [phone]  Name: Natarsha B Kandel MRN: [id number] Date of Birth: [birth year]

## 2020-12-09 ENCOUNTER — Encounter: Payer: Self-pay | Admitting: Physical Therapy

## 2020-12-09 ENCOUNTER — Ambulatory Visit: Payer: Medicare Other | Admitting: Physical Therapy

## 2020-12-09 ENCOUNTER — Other Ambulatory Visit: Payer: Self-pay

## 2020-12-09 DIAGNOSIS — M6281 Muscle weakness (generalized): Secondary | ICD-10-CM | POA: Diagnosis not present

## 2020-12-09 DIAGNOSIS — M25551 Pain in right hip: Secondary | ICD-10-CM | POA: Diagnosis not present

## 2020-12-09 NOTE — Therapy (Signed)
[facility] [address] [place], [place], [address] Phone: [phone]   Fax:  [phone]  Physical Therapy Treatment  Patient Details  Name: Tamara [facility] MRN: [id number] Date of Birth: 02-12-1941 Referring Provider (PT): Mechele Claude MD   Encounter Date: 12/09/2020   PT End of Session - 12/09/20 0906     Visit Number 8    Number of Visits 12    Date for PT Re-Evaluation 02/09/21    PT Start Time 0902    PT Stop Time 0944    PT Time Calculation (min) 42 min    Activity Tolerance Patient tolerated treatment well    Behavior During Therapy [facility] for tasks assessed/performed             Past Medical History:  Diagnosis Date   Gout    Hypertension    Osteoporosis     Past Surgical History:  Procedure Laterality Date   TONSILLECTOMY      There were no vitals filed for this visit.   Subjective Assessment - 12/09/20 0906     Subjective Covid-19 screening performed prior to entering clinic.Marland Kitchen Pt arrived today doing better with no pain.    Pertinent History OP, HTN, GOUT.    Limitations Walking    How long can you sit comfortably? Unlimited.    How long can you walk comfortably? Few blocks.    Diagnostic tests X-ray.    Patient Stated Goals Walk without pain.    Currently in Pain? No/denies                [facility] PT Assessment - 12/09/20 0001       Assessment   Medical Diagnosis Right hip pain.    Referring Provider (PT) Mechele Claude MD      Precautions   Precautions None      Restrictions   Weight Bearing Restrictions No                           [facility] Adult PT Treatment/Exercise - 12/09/20 0001       Knee/Hip Exercises: Aerobic   Nustep L4 x16 min      Knee/Hip Exercises: Machines for Strengthening   Cybex Knee Extension 10# attempted but stopped due to R knee popping      Knee/Hip Exercises: Standing   Hip Flexion Stengthening;Both;2 sets;10 reps;Knee bent;Limitations    Hip  Flexion Limitations 3#    Hip Abduction Stengthening;Both;2 sets;10 reps;Knee straight;Limitations    Abduction Limitations 3#    Hip Extension Stengthening;Both;2 sets;10 reps;Knee bent;Limitations    Extension Limitations 3#    Other Standing Knee Exercises sidestepping red theraband x3 laps // bars      Knee/Hip Exercises: Seated   Long Arc Quad Strengthening;Both;3 sets;10 reps;Weights    Long Arc Quad Weight 4 lbs.    Clamshell with TheraBand Green   x20 reps   Hamstring Curl Strengthening;Both;2 sets;10 reps    Hamstring Limitations green therababd                 Balance Exercises - 12/09/20 0001       Balance Exercises: Standing   Standing Eyes Closed Narrow base of support (BOS);Foam/compliant surface;1 rep;Time    Standing Eyes Closed Time 2 min    Tandem Stance Eyes open;Foam/compliant surface;1 rep;Time    Tandem Stance Limitations 2 min    Standing, One Foot on a Step Eyes open;Foam/compliant surface;8 inch;2 reps;Time  Standing, One Foot on a Step Time x1 min on floor, x1 min on airex                    PT Long Term Goals - 11/18/20 1040       PT LONG TERM GOAL #1   Title Independent with an HEP.    Time 6    Period Weeks    Status Achieved      PT LONG TERM GOAL #2   Title Right hip strength to 4+/5 to increase stability for functional tasks.    Time 6    Period Weeks    Status On-going      PT LONG TERM GOAL #3   Title Walk a community distance with right hip pain not > 3-4/10.    Time 6    Period Weeks    Status On-going                   Plan - 12/09/20 0947     Clinical Impression Statement Patient presented in clinic with reports of overall improvement as she notes less R hip pain. Patient able to stand for longer now and is to return to recreational walking within her community soon. Patient attempted starting machine strengthening but stopped due to patient reports of nonpainful R knee popping. Patient then  progressed with other forms of strengthening.    Personal Factors and Comorbidities Comorbidity 1;Other    Comorbidities OP, HTN, GOUT.    Examination-Activity Limitations Locomotion Level;Other    Examination-Participation Restrictions Other    Stability/Clinical Decision Making Evolving/Moderate complexity    Rehab Potential Good    PT Frequency 2x / week    PT Duration 6 weeks    PT Treatment/Interventions ADLs/Self Care Home Management;Electrical Stimulation;Cryotherapy;Moist Heat;Ultrasound;Neuromuscular re-education;Therapeutic exercise;Therapeutic activities;Functional mobility training;Stair training;Gait training;Manual techniques    PT Next Visit Plan Right hip strengthening to include "clamshell" exercise.  Nustep.  GT.    Consulted and Agree with Plan of Care Patient             Patient will benefit from skilled therapeutic intervention in order to improve the following deficits and impairments:  Abnormal gait, Decreased activity tolerance, Decreased strength, Difficulty walking, Postural dysfunction, Pain  Visit Diagnosis: Pain in right hip  Muscle weakness (generalized)     Problem List Patient Active Problem List   Diagnosis Date Noted   Anemia of chronic disease 11/06/2015   Vitamin D deficiency 07/05/2013   Postmenopausal 09/16/2012   Facial nerve palsy 09/16/2012   Osteoporosis 09/16/2012   CKD (chronic kidney disease) 09/16/2012   Gout 09/16/2012   Vitiligo 09/16/2012   HTN (hypertension) 09/16/2012    [name], PTA 12/09/2020, 9:57 AM  [facility] [address] [place], [place], [address] Phone: [phone]   Fax:  [phone]  Name: Tamara [facility] MRN: [id number] Date of Birth: 09/26/1940

## 2020-12-13 ENCOUNTER — Ambulatory Visit: Payer: Medicare Other | Admitting: *Deleted

## 2020-12-13 ENCOUNTER — Other Ambulatory Visit: Payer: Self-pay

## 2020-12-13 DIAGNOSIS — M6281 Muscle weakness (generalized): Secondary | ICD-10-CM

## 2020-12-13 DIAGNOSIS — M25551 Pain in right hip: Secondary | ICD-10-CM | POA: Diagnosis not present

## 2020-12-13 NOTE — Therapy (Signed)
[facility] [address] [place], [place], [address] Phone: [phone]   Fax:  [phone]  Physical Therapy Treatment  Patient Details  Name: Tamara [facility] MRN: [id number] Date of Birth: 1940-09-06 Referring Provider (PT): Mechele Claude MD   Encounter Date: 12/13/2020   PT End of Session - 12/13/20 0915     Visit Number 9    Number of Visits 12    Date for PT Re-Evaluation 02/09/21    PT Start Time 0900    PT Stop Time 0949    PT Time Calculation (min) 49 min             Past Medical History:  Diagnosis Date   Gout    Hypertension    Osteoporosis     Past Surgical History:  Procedure Laterality Date   TONSILLECTOMY      There were no vitals filed for this visit.   Subjective Assessment - 12/13/20 0913     Subjective Covid-19 screening performed prior to entering clinic.Marland Kitchen Pt arrived today doing better with mainly soreness. Was able to walk 3 blocks yesterday    Pertinent History OP, HTN, GOUT.    Limitations Walking    How long can you sit comfortably? Unlimited.    How long can you walk comfortably? Few blocks.    Diagnostic tests X-ray.    Patient Stated Goals Walk without pain.    Currently in Pain? No/denies    Pain Score 2     Pain Location Hip    Pain Orientation Right    Pain Descriptors / Indicators Sore    Pain Type Acute pain    Pain Onset More than a month ago                               [facility] Adult PT Treatment/Exercise - 12/13/20 0001       Exercises   Exercises Knee/Hip      Knee/Hip Exercises: Aerobic   Nustep L4 x15 min      Knee/Hip Exercises: Standing   Heel Raises Both;2 sets;15 reps    Hip ADduction Strengthening;Both;1 set   6 reps isometrics with 10 sec holds   Rocker Board 3 minutes   DF/PF balance, calf stretch   SLS x5 max hold 11 secs    Other Standing Knee Exercises sidestepping red theraband x3 laps // bars      Knee/Hip Exercises: Seated    Ball Squeeze x6  hold 10 secs                 Balance Exercises - 12/13/20 0001       Balance Exercises: Standing   Tandem Stance Eyes open;Foam/compliant surface   on balance beam   Balance Beam walking heel/toe and then tandem holds                    PT Long Term Goals - 11/18/20 1040       PT LONG TERM GOAL #1   Title Independent with an HEP.    Time 6    Period Weeks    Status Achieved      PT LONG TERM GOAL #2   Title Right hip strength to 4+/5 to increase stability for functional tasks.    Time 6    Period Weeks    Status On-going      PT LONG TERM GOAL #3   Title  Walk a community distance with right hip pain not > 3-4/10.    Time 6    Period Weeks    Status On-going                   Plan - 12/13/20 0954     Clinical Impression Statement Pt arrived today doing well still and reports increased function with being able to walk 3 blocks now without increased pain RT hip. Rx focused on balance and LE strengthening. She did well with added standing adductor isometrics and walking heel-toe on balance beam as well as SLS. hip strength improved to 4/10    Personal Factors and Comorbidities Comorbidity 1;Other    Comorbidities OP, HTN, GOUT.    Examination-Activity Limitations Locomotion Level;Other    Examination-Participation Restrictions Other    Stability/Clinical Decision Making Evolving/Moderate complexity    Rehab Potential Good    PT Frequency 2x / week    PT Duration 6 weeks    PT Treatment/Interventions ADLs/Self Care Home Management;Electrical Stimulation;Cryotherapy;Moist Heat;Ultrasound;Neuromuscular re-education;Therapeutic exercise;Therapeutic activities;Functional mobility training;Stair training;Gait training;Manual techniques    PT Next Visit Plan Right hip strengthening to include "clamshell" exercise.  Nustep.  GT.    Consulted and Agree with Plan of Care Patient             Patient will benefit from skilled  therapeutic intervention in order to improve the following deficits and impairments:  Abnormal gait, Decreased activity tolerance, Decreased strength, Difficulty walking, Postural dysfunction, Pain  Visit Diagnosis: Pain in right hip  Muscle weakness (generalized)     Problem List Patient Active Problem List   Diagnosis Date Noted   Anemia of chronic disease 11/06/2015   Vitamin D deficiency 07/05/2013   Postmenopausal 09/16/2012   Facial nerve palsy 09/16/2012   Osteoporosis 09/16/2012   CKD (chronic kidney disease) 09/16/2012   Gout 09/16/2012   Vitiligo 09/16/2012   HTN (hypertension) 09/16/2012    Shavar Gorka,[name], PTA 12/13/2020, 10:45 AM  [facility] [address] [place], [place], [address] Phone: [phone]   Fax:  [phone]  Name: Tamara [facility] MRN: [id number] Date of Birth: 1940-05-18

## 2020-12-16 ENCOUNTER — Ambulatory Visit: Payer: Medicare Other | Admitting: Physical Therapy

## 2020-12-16 ENCOUNTER — Other Ambulatory Visit: Payer: Self-pay

## 2020-12-16 ENCOUNTER — Encounter: Payer: Self-pay | Admitting: Physical Therapy

## 2020-12-16 DIAGNOSIS — M25551 Pain in right hip: Secondary | ICD-10-CM | POA: Diagnosis not present

## 2020-12-16 DIAGNOSIS — M6281 Muscle weakness (generalized): Secondary | ICD-10-CM

## 2020-12-16 NOTE — Therapy (Signed)
[facility] [place], [place], [address] Phone: [phone]   Fax:  [phone]  Physical Therapy Treatment and 10th Visit Progress Note  Patient Details  Name: Tamara Gross MRN: [id number] Date of Birth: [birth year] Referring Provider (PT): Claretta Fraise MD   Encounter Date: 12/16/2020  Physical Therapy Progress Note  Dates of Reporting Period: 11/11/20 to 12/16/20    PT End of Session - 12/16/20 0952     Visit Number 10    Number of Visits 12    Date for PT Re-Evaluation 02/09/21    Authorization Type MCR    Progress Note Due on Visit 10    PT Start Time 0945    PT Stop Time 1027    PT Time Calculation (min) 42 min    Activity Tolerance Patient tolerated treatment well    Behavior During Therapy [facility] for tasks assessed/performed             Past Medical History:  Diagnosis Date   Gout    Hypertension    Osteoporosis     Past Surgical History:  Procedure Laterality Date   TONSILLECTOMY      There were no vitals filed for this visit.   Subjective Assessment - 12/16/20 0954     Subjective Covid-19 screening performed prior to entering clinic..I'm sore in the morning, but I feel better as I move around.  Was able to walk 4 blocks last week    Pertinent History OP, HTN, GOUT.    Patient Stated Goals Walk without pain.    Currently in Pain? No/denies                [facility] PT Assessment - 12/16/20 0001       Strength   Overall Strength Comments rt hip flex 5/5, left 4+/5; ABD bil 5/5 in sitting; ADD 4/5                           [facility] Adult PT Treatment/Exercise - 12/16/20 0001       Knee/Hip Exercises: Aerobic   Nustep L4 x15 min      Knee/Hip Exercises: Standing   Heel Raises Both;20 reps;10 reps    Hip Flexion Stengthening;Both;2 sets;10 reps;Knee bent;Limitations    Hip Flexion Limitations 3#    Hip ADduction Both;10 reps    Hip ADduction Limitations 5 sec hold with  ball    Hip Abduction Stengthening;Both;2 sets;10 reps;Knee straight;Limitations    Abduction Limitations 3#    Hip Extension Stengthening;Both;2 sets;10 reps;Limitations;Knee straight    Extension Limitations 3#    Rocker Board 1 minute    SLS x5 max hold; semi tandem x 30 sec left back; 10 sec with right foot back    Other Standing Knee Exercises sidestepping red theraband x8 laps // bars      Knee/Hip Exercises: Seated   Ball Squeeze 5 sec hold x 10 weighted ball                         PT Long Term Goals - 12/16/20 0953       PT LONG TERM GOAL #1   Title Independent with an HEP.    Status Achieved      PT LONG TERM GOAL #2   Title Right hip strength to 4+/5 to increase stability for functional tasks.    Status Partially Met      PT LONG  TERM GOAL #3   Title Walk a community distance with right hip pain not > 3-4/10.    Baseline no pain at end of walk, just fatigue.    Status Achieved                   Plan - 12/16/20 1027     Clinical Impression Statement Patient is doing well with LTGs. She reports ability to walk without pain in the community and her standing endurance has improved as well. Hip flexion tested higher today. Her main weakness is with hip ADDuction. She shows some evidence of functional ABDuctor weakness with SLS and tandem stance on the right, but overall is progressing. She should be ready for d/c after next 2 visits.    Comorbidities OP, HTN, GOUT.    PT Frequency 2x / week    PT Duration 6 weeks    PT Treatment/Interventions ADLs/Self Care Home Management;Electrical Stimulation;Cryotherapy;Moist Heat;Ultrasound;Neuromuscular re-education;Therapeutic exercise;Therapeutic activities;Functional mobility training;Stair training;Gait training;Manual techniques    PT Next Visit Plan progress with strengthening. D/C at 12th visit.             Patient will benefit from skilled therapeutic intervention in order to improve the  following deficits and impairments:  Abnormal gait, Decreased activity tolerance, Decreased strength, Difficulty walking, Postural dysfunction, Pain  Visit Diagnosis: Muscle weakness (generalized)  Pain in right hip     Problem List Patient Active Problem List   Diagnosis Date Noted   Anemia of chronic disease 11/06/2015   Vitamin D deficiency 07/05/2013   Postmenopausal 09/16/2012   Facial nerve palsy 09/16/2012   Osteoporosis 09/16/2012   CKD (chronic kidney disease) 09/16/2012   Gout 09/16/2012   Vitiligo 09/16/2012   HTN (hypertension) 09/16/2012    Tamara Gross PT 12/16/2020, 12:46 PM  [facility] [address] [place], [place], [address] Phone: [phone]   Fax:  [phone]  Name: Tamara Gross MRN: [id number] Date of Birth: 1940-08-15

## 2020-12-20 ENCOUNTER — Encounter: Payer: Medicare Other | Admitting: Physical Therapy

## 2020-12-24 ENCOUNTER — Encounter: Payer: Self-pay | Admitting: Physical Therapy

## 2020-12-24 ENCOUNTER — Ambulatory Visit: Payer: Medicare Other | Attending: Family Medicine | Admitting: Physical Therapy

## 2020-12-24 ENCOUNTER — Other Ambulatory Visit: Payer: Self-pay

## 2020-12-24 DIAGNOSIS — M25551 Pain in right hip: Secondary | ICD-10-CM | POA: Insufficient documentation

## 2020-12-24 DIAGNOSIS — M6281 Muscle weakness (generalized): Secondary | ICD-10-CM | POA: Insufficient documentation

## 2020-12-24 NOTE — Therapy (Signed)
[facility] [place], [place], [address] Phone: [phone]   Fax:  [phone]  Physical Therapy Treatment  Patient Details  Name: Tamara Gross MRN: [id number] Date of Birth: August 17, 1940 Referring Provider (PT): Claretta Fraise MD   Encounter Date: 12/24/2020   PT End of Session - 12/24/20 0919     Visit Number 11    Number of Visits 12    Date for PT Re-Evaluation 02/09/21    Authorization Type MCR    PT Start Time 0902    PT Stop Time 0943    PT Time Calculation (min) 41 min    Activity Tolerance Patient tolerated treatment well    Behavior During Therapy [facility] for tasks assessed/performed             Past Medical History:  Diagnosis Date   Gout    Hypertension    Osteoporosis     Past Surgical History:  Procedure Laterality Date   TONSILLECTOMY      There were no vitals filed for this visit.   Subjective Assessment - 12/24/20 0903     Subjective Covid-19 screening performed prior to entering clinic. has some days of pain and some days not.    Pertinent History OP, HTN, GOUT.    Limitations Walking    How long can you sit comfortably? Unlimited.    How long can you walk comfortably? Few blocks.    Diagnostic tests X-ray.    Patient Stated Goals Walk without pain.    Currently in Pain? No/denies                [facility] PT Assessment - 12/24/20 0001       Assessment   Medical Diagnosis Right hip pain.    Referring Provider (PT) Claretta Fraise MD      Precautions   Precautions None                           [facility] Adult PT Treatment/Exercise - 12/24/20 0001       Knee/Hip Exercises: Aerobic   Nustep L4 x17 min      Knee/Hip Exercises: Seated   Long Arc Quad Strengthening;Both;3 sets;10 reps;Weights    Long Arc Quad Weight 5 lbs.    Sit to Sand 20 reps;without UE support;Other (comment)   green theraband     Knee/Hip Exercises: Supine   Bridges with Clamshell  Strengthening;Both;3 sets;10 reps;Limitations   green theraband   Other Supine Knee/Hip Exercises B hip clam green theraband x20 reps      Knee/Hip Exercises: Sidelying   Hip ABduction Strengthening;Right;2 sets;10 reps;Limitations    Hip ABduction Limitations green theraband "a little bit of pain"    Clams R hip clam green theraband x20 reps                         PT Long Term Goals - 12/16/20 9794       PT LONG TERM GOAL #1   Title Independent with an HEP.    Status Achieved      PT LONG TERM GOAL #2   Title Right hip strength to 4+/5 to increase stability for functional tasks.    Status Partially Met      PT LONG TERM GOAL #3   Title Walk a community distance with right hip pain not > 3-4/10.    Baseline no pain at end of walk, just  fatigue.    Status Achieved                   Plan - 12/24/20 1046     Clinical Impression Statement Patient presented in clinic with reports of less but intermittant R hip pain. Patient now ambulating more recreationally and interested in returning to gym where equipment is present. Patient progressed to more antigravity and resisted strengthening completed in supine and SL. Only reports of some discomfort were reported during SL hip abduction.    Personal Factors and Comorbidities Comorbidity 1;Other    Comorbidities OP, HTN, GOUT.    Examination-Activity Limitations Locomotion Level;Other    Examination-Participation Restrictions Other    Stability/Clinical Decision Making Evolving/Moderate complexity    Rehab Potential Good    PT Frequency 2x / week    PT Duration 6 weeks    PT Treatment/Interventions ADLs/Self Care Home Management;Electrical Stimulation;Cryotherapy;Moist Heat;Ultrasound;Neuromuscular re-education;Therapeutic exercise;Therapeutic activities;Functional mobility training;Stair training;Gait training;Manual techniques    PT Next Visit Plan DC next visit.    Consulted and Agree with Plan of Care Patient              Patient will benefit from skilled therapeutic intervention in order to improve the following deficits and impairments:  Abnormal gait, Decreased activity tolerance, Decreased strength, Difficulty walking, Postural dysfunction, Pain  Visit Diagnosis: Pain in right hip  Muscle weakness (generalized)     Problem List Patient Active Problem List   Diagnosis Date Noted   Anemia of chronic disease 11/06/2015   Vitamin D deficiency 07/05/2013   Postmenopausal 09/16/2012   Facial nerve palsy 09/16/2012   Osteoporosis 09/16/2012   CKD (chronic kidney disease) 09/16/2012   Gout 09/16/2012   Vitiligo 09/16/2012   HTN (hypertension) 09/16/2012    Standley Brooking, PTA 12/24/2020, 10:49 AM  [facility] [address] [place], [place], [address] Phone: [phone]   Fax:  [phone]  Name: Tamara Gross MRN: [id number] Date of Birth: 01-30-1941

## 2020-12-26 ENCOUNTER — Other Ambulatory Visit: Payer: Self-pay

## 2020-12-26 ENCOUNTER — Ambulatory Visit: Payer: Medicare Other | Admitting: Physical Therapy

## 2020-12-26 DIAGNOSIS — M25551 Pain in right hip: Secondary | ICD-10-CM

## 2020-12-26 DIAGNOSIS — M6281 Muscle weakness (generalized): Secondary | ICD-10-CM

## 2020-12-26 NOTE — Therapy (Signed)
[facility] [place], [place], [address] Phone: [phone]   Fax:  [phone]  Physical Therapy Treatment  Patient Details  Name: Tamara Gross MRN: [id number] Date of Birth: June 13, 1940 Referring Provider (PT): Claretta Fraise MD   Encounter Date: 12/26/2020   PT End of Session - 12/26/20 0916     Visit Number 12    Number of Visits 12    Date for PT Re-Evaluation 02/09/21    Authorization Type MCR    PT Start Time 0900    PT Stop Time 0940    PT Time Calculation (min) 40 min    Activity Tolerance Patient tolerated treatment well    Behavior During Therapy [facility] for tasks assessed/performed             Past Medical History:  Diagnosis Date   Gout    Hypertension    Osteoporosis     Past Surgical History:  Procedure Laterality Date   TONSILLECTOMY      There were no vitals filed for this visit.   Subjective Assessment - 12/26/20 0909     Subjective Covid-19 screening performed prior to entering clinic. Patient arrived with good overall progress.    Pertinent History OP, HTN, GOUT.    Limitations Walking    How long can you sit comfortably? Unlimited.    How long can you walk comfortably? Few blocks.    Diagnostic tests X-ray.    Patient Stated Goals Walk without pain.    Currently in Pain? No/denies                [facility] PT Assessment - 12/26/20 0001       Strength   Strength Assessment Site Hip    Right/Left Hip Right    Right Hip ABduction 4+/5    Right Hip ADduction 4+/5                           [facility] Adult PT Treatment/Exercise - 12/26/20 0001       Knee/Hip Exercises: Aerobic   Nustep L4 x17 min UE/LE monitored for progression      Knee/Hip Exercises: Standing   Heel Raises Both;2 sets;10 reps    Heel Raises Limitations B toe raise x20 reps    Hip Abduction Stengthening;Both;2 sets;10 reps;Knee straight   no UE support     Knee/Hip Exercises: Seated   Long  Arc Quad Strengthening;Both;3 sets;10 reps;Weights    Long Arc Quad Weight 5 lbs.    Clamshell with TheraBand Green   x20   Marching Strengthening;Both;2 sets;10 reps    Marching Limitations green band    Hamstring Curl Strengthening;Both;2 sets;10 reps    Hamstring Limitations green therababd    Sit to Sand 20 reps;without UE support;Other (comment)   with green band     Knee/Hip Exercises: Supine   Bridges Strengthening;Both;20 reps    Straight Leg Raises Strengthening;Both;1 set;10 reps    Other Supine Knee/Hip Exercises ball squeeze 5sec x30 reps                          PT Long Term Goals - 12/26/20 0910       PT LONG TERM GOAL #1   Title Independent with an HEP.    Time 6    Period Weeks    Status Achieved      PT LONG TERM GOAL #2   Title  Right hip strength to 4+/5 to increase stability for functional tasks.    Baseline Met 12/26/20    Time 6    Period Weeks    Status Achieved      PT LONG TERM GOAL #3   Title Walk a community distance with right hip pain not > 3-4/10.    Baseline no pain at end of walk, just fatigue.    Time 6    Period Weeks    Status Achieved                   Plan - 12/26/20 0936     Clinical Impression Statement Patient has met all current goals and DC to HEP per PT. No pain, improved strength and independent with ADL's.    Personal Factors and Comorbidities Comorbidity 1;Other    Comorbidities OP, HTN, GOUT.    Examination-Activity Limitations Locomotion Level;Other    Examination-Participation Restrictions Other    Stability/Clinical Decision Making Evolving/Moderate complexity    Rehab Potential Good    PT Frequency 2x / week    PT Duration 6 weeks    PT Treatment/Interventions ADLs/Self Care Home Management;Electrical Stimulation;Cryotherapy;Moist Heat;Ultrasound;Neuromuscular re-education;Therapeutic exercise;Therapeutic activities;Functional mobility training;Stair training;Gait training;Manual techniques     PT Next Visit Plan DC    Consulted and Agree with Plan of Care Patient             Patient will benefit from skilled therapeutic intervention in order to improve the following deficits and impairments:  Abnormal gait, Decreased activity tolerance, Decreased strength, Difficulty walking, Postural dysfunction, Pain  Visit Diagnosis: Pain in right hip  Muscle weakness (generalized)     Problem List Patient Active Problem List   Diagnosis Date Noted   Anemia of chronic disease 11/06/2015   Vitamin D deficiency 07/05/2013   Postmenopausal 09/16/2012   Facial nerve palsy 09/16/2012   Osteoporosis 09/16/2012   CKD (chronic kidney disease) 09/16/2012   Gout 09/16/2012   Vitiligo 09/16/2012   HTN (hypertension) 09/16/2012    Carrson Lightcap P, PTA 12/26/2020, 9:40 AM  [facility] [address] [place], [place], [address] Phone: [phone]   Fax:  [phone]  Name: Tamara Gross MRN: [id number] Date of Birth: 02-10-1941  PHYSICAL THERAPY DISCHARGE SUMMARY  Visits from Start of Care: 12  Current functional level related to goals / functional outcomes: See above.   Remaining deficits: All goals met.   Education / Equipment: HEP.   Patient agrees to discharge. Patient goals were met. Patient is being discharged due to meeting the stated rehab goals.    Mali Applegate MPT

## 2020-12-27 ENCOUNTER — Encounter: Payer: Medicare Other | Admitting: *Deleted

## 2021-01-23 ENCOUNTER — Other Ambulatory Visit: Payer: Self-pay

## 2021-01-23 ENCOUNTER — Encounter: Payer: Self-pay | Admitting: Family Medicine

## 2021-01-23 ENCOUNTER — Ambulatory Visit (INDEPENDENT_AMBULATORY_CARE_PROVIDER_SITE_OTHER): Payer: Medicare Other | Admitting: Family Medicine

## 2021-01-23 ENCOUNTER — Ambulatory Visit: Payer: Medicare Other

## 2021-01-23 VITALS — BP 145/76 | HR 96 | Temp 97.8°F | Ht 61.0 in | Wt 98.4 lb

## 2021-01-23 DIAGNOSIS — Z23 Encounter for immunization: Secondary | ICD-10-CM | POA: Diagnosis not present

## 2021-01-23 DIAGNOSIS — R300 Dysuria: Secondary | ICD-10-CM

## 2021-01-23 DIAGNOSIS — N3001 Acute cystitis with hematuria: Secondary | ICD-10-CM | POA: Diagnosis not present

## 2021-01-23 LAB — URINALYSIS, ROUTINE W REFLEX MICROSCOPIC
Bilirubin, UA: NEGATIVE
Glucose, UA: NEGATIVE
Ketones, UA: NEGATIVE
Nitrite, UA: NEGATIVE
Protein,UA: NEGATIVE
Specific Gravity, UA: 1.01 (ref 1.005–1.030)
Urobilinogen, Ur: 0.2 mg/dL (ref 0.2–1.0)
pH, UA: 5.5 (ref 5.0–7.5)

## 2021-01-23 LAB — MICROSCOPIC EXAMINATION
Renal Epithel, UA: NONE SEEN /hpf
WBC, UA: >30 /hpf — AB (ref 0–5)

## 2021-01-23 MED ORDER — SULFAMETHOXAZOLE-TRIMETHOPRIM 800-160 MG PO TABS: 1.0000 | ORAL_TABLET | Freq: Two times a day (BID) | ORAL | 0 refills | Status: AC

## 2021-01-23 NOTE — Progress Notes (Signed)
Subjective:  Patient ID: Tamara Gross, female    DOB: [birth year], 80 y.o.   MRN: [id number]  Patient Care Team: Claretta Fraise, MD as PCP - General (Family Medicine)   Chief Complaint:  Chronic Kidney Disease (Symptoms x 1 week)   HPI: Tamara Gross is a 80 y.o. female presenting on 01/23/2021 for Chronic Kidney Disease (Symptoms x 1 week)   Pt presents today with complaints of dysuria, frequency, and urgency for 1 week.  Dysuria  This is a new problem. The current episode started in the past 7 days. The problem occurs every urination. The problem has been gradually worsening. The quality of the pain is described as burning and aching. The pain is at a severity of 3/10. The pain is mild. There has been no fever. She is Not sexually active. There is No history of pyelonephritis. Associated symptoms include frequency and urgency. Pertinent negatives include no chills, discharge, flank pain, hematuria, hesitancy, nausea, possible pregnancy, sweats or vomiting. She has tried increased fluids for the symptoms. The treatment provided no relief.    Relevant past medical, surgical, family, and social history reviewed and updated as indicated.  Allergies and medications reviewed and updated. Data reviewed: Chart in Epic.   Past Medical History:  Diagnosis Date   Gout    Hypertension    Osteoporosis     Past Surgical History:  Procedure Laterality Date   TONSILLECTOMY      Social History   Socioeconomic History   Marital status: Widowed    Spouse name: Not on file   Number of children: 0   Years of education: college   Highest education level: Associate degree: academic program  Occupational History   Occupation: Retired  Tobacco Use   Smoking status: Never   Smokeless tobacco: Never  Scientific laboratory technician Use: Never used  Substance and Sexual Activity   Alcohol use: Never   Drug use: Never   Sexual activity: Not Currently  Other Topics Concern   Not on file  Social  History Narrative   Not on file   Social Determinants of Health   Financial Resource Strain: Low Risk    Difficulty of Paying Living Expenses: Not hard at all  Food Insecurity: No Food Insecurity   Worried About Charity fundraiser in the Last Year: Never true   [place] in the Last Year: Never true  Transportation Needs: No Transportation Needs   Lack of Transportation (Medical): No   Lack of Transportation (Non-Medical): No  Physical Activity: Inactive   Days of Exercise per Week: 0 days   Minutes of Exercise per Session: 0 min  Stress: No Stress Concern Present   Feeling of Stress : Not at all  Social Connections: Moderately Integrated   Frequency of Communication with Friends and Family: Three times a week   Frequency of Social Gatherings with Friends and Family: Once a week   Attends Religious Services: More than 4 times per year   Active Member of [employer] or Organizations: Yes   Attends Archivist Meetings: More than 4 times per year   Marital Status: Widowed  Intimate Partner Violence: Not on file    Outpatient Encounter Medications as of 01/23/2021  Medication Sig   alendronate (FOSAMAX) 70 MG tablet Take 1 tablet (70 mg total) by mouth every 7 (seven) days. Take with a full glass of water on an empty stomach. Do not lay down for at least  2 hours   allopurinol (ZYLOPRIM) 100 MG tablet Take 1 tablet (100 mg total) by mouth daily.   calcitonin, salmon, (MIACALCIN/FORTICAL) 200 UNIT/ACT nasal spray Place 1 spray into alternate nostrils daily.   Cholecalciferol (VITAMIN D) 2000 UNITS CAPS Take 1 capsule by mouth.   lisinopril (ZESTRIL) 20 MG tablet Take 1 tablet by mouth once daily   Multiple Vitamin (MULTIVITAMIN) tablet Take 1 tablet by mouth daily.   NIFEdipine (PROCARDIA XL/NIFEDICAL XL) 60 MG 24 hr tablet Take 1 tablet (60 mg total) by mouth daily.   sulfamethoxazole-trimethoprim (BACTRIM DS) 800-160 MG tablet Take 1 tablet by mouth 2 (two) times daily for  5 days.   No facility-administered encounter medications on file as of 01/23/2021.    No Known Allergies  Review of Systems  Constitutional:  Negative for activity change, appetite change, chills, diaphoresis, fatigue, fever and unexpected weight change.  Gastrointestinal:  Negative for abdominal pain, nausea and vomiting.  Genitourinary:  Positive for dysuria, frequency and urgency. Negative for decreased urine volume, difficulty urinating, dyspareunia, enuresis, flank pain, hematuria, hesitancy, pelvic pain, vaginal bleeding, vaginal discharge and vaginal pain.  Musculoskeletal:  Negative for back pain.  Neurological:  Negative for weakness.  Psychiatric/Behavioral:  Negative for confusion.   All other systems reviewed and are negative.      Objective:  BP (!) 145/76   Pulse 96   Temp 97.8 F (36.6 C)   Ht '5\' 1"'  (1.549 m)   Wt 98 lb 6.4 oz (44.6 kg)   SpO2 98%   BMI 18.59 kg/m    Wt Readings from Last 3 Encounters:  01/23/21 98 lb 6.4 oz (44.6 kg)  10/30/20 98 lb 3.2 oz (44.5 kg)  05/02/20 104 lb 2 oz (47.2 kg)    Physical Exam Vitals and nursing note reviewed.  Constitutional:      General: She is not in acute distress.    Appearance: Normal appearance. She is well-developed and well-groomed. She is not ill-appearing, toxic-appearing or diaphoretic.  HENT:     Head: Normocephalic and atraumatic.     Jaw: There is normal jaw occlusion.     Right Ear: Hearing normal.     Left Ear: Hearing normal.     Nose: Nose normal.     Mouth/Throat:     Lips: Pink.     Mouth: Mucous membranes are moist.     Pharynx: Oropharynx is clear. Uvula midline.  Eyes:     General: Lids are normal.     Conjunctiva/sclera: Conjunctivae normal.     Pupils: Pupils are equal, round, and reactive to light.  Neck:     Thyroid: No thyroid mass, thyromegaly or thyroid tenderness.     Vascular: No JVD.     Trachea: Trachea and phonation normal.  Cardiovascular:     Rate and Rhythm: Normal  rate and regular rhythm.     Chest Wall: PMI is not displaced.     Pulses: Normal pulses.     Heart sounds: Normal heart sounds. No murmur heard.   No friction rub. No gallop.  Pulmonary:     Effort: Pulmonary effort is normal. No respiratory distress.     Breath sounds: Normal breath sounds. No wheezing.  Abdominal:     General: Bowel sounds are normal. There is no distension or abdominal bruit.     Palpations: Abdomen is soft. There is no hepatomegaly, splenomegaly or mass.     Tenderness: There is no abdominal tenderness. There is no right CVA tenderness,  left CVA tenderness, guarding or rebound.     Hernia: No hernia is present.  Musculoskeletal:     Right lower leg: No edema.     Left lower leg: No edema.  Skin:    General: Skin is warm and dry.     Capillary Refill: Capillary refill takes less than 2 seconds.     Coloration: Skin is not cyanotic, jaundiced or pale.  Neurological:     General: No focal deficit present.     Mental Status: She is alert and oriented to person, place, and time.     Cranial Nerves: Cranial nerves are intact.     Sensory: Sensation is intact.     Motor: Motor function is intact.     Coordination: Coordination is intact.     Gait: Gait is intact.     Deep Tendon Reflexes: Reflexes are normal and symmetric.  Psychiatric:        Attention and Perception: Attention and perception normal.        Mood and Affect: Mood and affect normal.        Speech: Speech normal.        Behavior: Behavior normal. Behavior is cooperative.        Thought Content: Thought content normal.        Cognition and Memory: Cognition and memory normal.        Judgment: Judgment normal.    Results for orders placed or performed in visit on 10/30/20  CBC with Differential/Platelet  Result Value Ref Range   WBC 6.2 3.4 - 10.8 x10E3/uL   RBC 3.49 (L) 3.77 - 5.28 x10E6/uL   Hemoglobin 11.2 11.1 - 15.9 g/dL   Hematocrit 34.0 34.0 - 46.6 %   MCV 97 79 - 97 fL   MCH 32.1 26.6  - 33.0 pg   MCHC 32.9 31.5 - 35.7 g/dL   RDW 12.9 11.7 - 15.4 %   Platelets 295 150 - 450 x10E3/uL   Neutrophils 62 Not Estab. %   Lymphs 25 Not Estab. %   Monocytes 9 Not Estab. %   Eos 3 Not Estab. %   Basos 1 Not Estab. %   Neutrophils Absolute 3.9 1.4 - 7.0 x10E3/uL   Lymphocytes Absolute 1.5 0.7 - 3.1 x10E3/uL   Monocytes Absolute 0.5 0.1 - 0.9 x10E3/uL   EOS (ABSOLUTE) 0.2 0.0 - 0.4 x10E3/uL   Basophils Absolute 0.0 0.0 - 0.2 x10E3/uL   Immature Granulocytes 0 Not Estab. %   Immature Grans (Abs) 0.0 0.0 - 0.1 x10E3/uL  CMP14+EGFR  Result Value Ref Range   Glucose 95 65 - 99 mg/dL   BUN 34 (H) 8 - 27 mg/dL   Creatinine, Ser 1.59 (H) 0.57 - 1.00 mg/dL   eGFR 33 (L) >59 mL/min/1.73   BUN/Creatinine Ratio 21 12 - 28   Sodium 136 134 - 144 mmol/L   Potassium 5.0 3.5 - 5.2 mmol/L   Chloride 104 96 - 106 mmol/L   CO2 18 (L) 20 - 29 mmol/L   Calcium 10.0 8.7 - 10.3 mg/dL   Total Protein 7.0 6.0 - 8.5 g/dL   Albumin 4.6 3.7 - 4.7 g/dL   Globulin, Total 2.4 1.5 - 4.5 g/dL   Albumin/Globulin Ratio 1.9 1.2 - 2.2   Bilirubin Total 0.3 0.0 - 1.2 mg/dL   Alkaline Phosphatase 53 44 - 121 IU/L   AST 14 0 - 40 IU/L   ALT 8 0 - 32 IU/L  Lipid panel  Result Value  Ref Range   Cholesterol, Total 179 100 - 199 mg/dL   Triglycerides 98 0 - 149 mg/dL   HDL 51 >39 mg/dL   VLDL Cholesterol Cal 18 5 - 40 mg/dL   LDL Chol Calc (NIH) 110 (H) 0 - 99 mg/dL   Chol/HDL Ratio 3.5 0.0 - 4.4 ratio  VITAMIN D 25 Hydroxy (Vit-D Deficiency, Fractures)  Result Value Ref Range   Vit D, 25-Hydroxy 35.9 30.0 - 100.0 ng/mL  Uric acid  Result Value Ref Range   Uric Acid 5.8 3.1 - 7.9 mg/dL     Urinalysis in office: 3+ leukocytes, trace blood, negative nitrites.  Pertinent labs & imaging results that were available during my care of the patient were reviewed by me and considered in my medical decision making.  Assessment & Plan:  Tamara Gross was seen today for chronic kidney disease.  Diagnoses and all  orders for this visit:  Dysuria Urinalysis as noted. Culture pending. Symptomatic care discussed in detail. Increase water intake and avoid bladder irritants such as caffeine.  -     Urine Culture -     Urinalysis, Routine w reflex microscopic  Acute cystitis with hematuria Urinalysis as noted. Prior culture results reviewed and antibiotic choice based on results. Will change antibiotic therapy if warranted. Symptomatic care discussed in detail. Increase water intake and avoid bladder irritants such as caffeine. Medications as prescribed. Repeat urinalysis in 3 weeks.  -     Urine Culture -     sulfamethoxazole-trimethoprim (BACTRIM DS) 800-160 MG tablet; Take 1 tablet by mouth 2 (two) times daily for 5 days.  Need for immunization against influenza -     Flu Vaccine QUAD High Dose(Fluad)    Continue all other maintenance medications.  Follow up plan: Return in about 3 weeks (around 02/13/2021), or if symptoms worsen or fail to improve, for Urine Recheck.   Continue healthy lifestyle choices, including diet (rich in fruits, vegetables, and lean proteins, and low in salt and simple carbohydrates) and exercise (at least 30 minutes of moderate physical activity daily).  Educational handout given for influenza vaccination VIS  The above assessment and management plan was discussed with the patient. The patient verbalized understanding of and has agreed to the management plan. Patient is aware to call the clinic if they develop any new symptoms or if symptoms persist or worsen. Patient is aware when to return to the clinic for a follow-up visit. Patient educated on when it is appropriate to go to the emergency department.   Monia Pouch, FNP-C [facility] [phone]

## 2021-01-27 LAB — URINE CULTURE

## 2021-02-17 ENCOUNTER — Ambulatory Visit: Payer: Medicare Other | Admitting: Family Medicine

## 2021-02-18 ENCOUNTER — Ambulatory Visit (INDEPENDENT_AMBULATORY_CARE_PROVIDER_SITE_OTHER): Payer: Medicare Other | Admitting: Family Medicine

## 2021-02-18 ENCOUNTER — Encounter: Payer: Self-pay | Admitting: Family Medicine

## 2021-02-18 ENCOUNTER — Other Ambulatory Visit: Payer: Self-pay

## 2021-02-18 VITALS — BP 140/77 | HR 83 | Temp 98.1°F | Ht 61.0 in | Wt 101.0 lb

## 2021-02-18 DIAGNOSIS — N390 Urinary tract infection, site not specified: Secondary | ICD-10-CM

## 2021-02-18 DIAGNOSIS — R300 Dysuria: Secondary | ICD-10-CM

## 2021-02-18 LAB — MICROSCOPIC EXAMINATION: Renal Epithel, UA: NONE SEEN /hpf

## 2021-02-18 LAB — URINALYSIS, ROUTINE W REFLEX MICROSCOPIC
Bilirubin, UA: NEGATIVE
Glucose, UA: NEGATIVE
Ketones, UA: NEGATIVE
Nitrite, UA: NEGATIVE
Protein,UA: NEGATIVE
RBC, UA: NEGATIVE
Specific Gravity, UA: 1.015 (ref 1.005–1.030)
Urobilinogen, Ur: 0.2 mg/dL (ref 0.2–1.0)
pH, UA: 5.5 (ref 5.0–7.5)

## 2021-02-18 MED ORDER — CIPROFLOXACIN HCL 500 MG PO TABS: 500.0000 mg | ORAL_TABLET | Freq: Two times a day (BID) | ORAL | 0 refills | Status: AC

## 2021-02-18 NOTE — Progress Notes (Signed)
Subjective:  Patient ID: Tamara Gross, female    DOB: [birth year], 80 y.o.   MRN: [id number]  Patient Care Team: Claretta Fraise, MD as PCP - General (Family Medicine)   Chief Complaint:  Urinary Tract Infection   HPI: Tamara Gross is a 80 y.o. female presenting on 02/18/2021 for Urinary Tract Infection   Pt returns today with recurrent UTI symptoms. She was seen 01/23/2021 for same, treated with bactrim for E. Coli UTI. Symptoms resolved for a few days and then returned. She denies fever, chills, flank pain, hematuria, confusion, or weakness.   Urinary Tract Infection  This is a recurrent problem. The current episode started in the past 7 days. The problem occurs every urination. The problem has been unchanged. The quality of the pain is described as burning. The pain is at a severity of 2/10. The pain is mild. There has been no fever. She is Not sexually active. There is No history of pyelonephritis. Associated symptoms include frequency and urgency. Pertinent negatives include no chills, discharge, flank pain, hematuria, hesitancy, nausea, possible pregnancy, sweats or vomiting. She has tried increased fluids for the symptoms. The treatment provided no relief.    Relevant past medical, surgical, family, and social history reviewed and updated as indicated.  Allergies and medications reviewed and updated. Data reviewed: Chart in Epic.   Past Medical History:  Diagnosis Date   Gout    Hypertension    Osteoporosis     Past Surgical History:  Procedure Laterality Date   TONSILLECTOMY      Social History   Socioeconomic History   Marital status: Widowed    Spouse name: Not on file   Number of children: 0   Years of education: college   Highest education level: Associate degree: academic program  Occupational History   Occupation: Retired  Tobacco Use   Smoking status: Never   Smokeless tobacco: Never  Scientific laboratory technician Use: Never used  Substance and Sexual  Activity   Alcohol use: Never   Drug use: Never   Sexual activity: Not Currently  Other Topics Concern   Not on file  Social History Narrative   Not on file   Social Determinants of Health   Financial Resource Strain: Low Risk    Difficulty of Paying Living Expenses: Not hard at all  Food Insecurity: No Food Insecurity   Worried About Charity fundraiser in the Last Year: Never true   [place] in the Last Year: Never true  Transportation Needs: No Transportation Needs   Lack of Transportation (Medical): No   Lack of Transportation (Non-Medical): No  Physical Activity: Inactive   Days of Exercise per Week: 0 days   Minutes of Exercise per Session: 0 min  Stress: No Stress Concern Present   Feeling of Stress : Not at all  Social Connections: Moderately Integrated   Frequency of Communication with Friends and Family: Three times a week   Frequency of Social Gatherings with Friends and Family: Once a week   Attends Religious Services: More than 4 times per year   Active Member of [employer] or Organizations: Yes   Attends Archivist Meetings: More than 4 times per year   Marital Status: Widowed  Intimate Partner Violence: Not on file    Outpatient Encounter Medications as of 02/18/2021  Medication Sig   alendronate (FOSAMAX) 70 MG tablet Take 1 tablet (70 mg total) by mouth every 7 (seven) days.  Take with a full glass of water on an empty stomach. Do not lay down for at least 2 hours   allopurinol (ZYLOPRIM) 100 MG tablet Take 1 tablet (100 mg total) by mouth daily.   calcitonin, salmon, (MIACALCIN/FORTICAL) 200 UNIT/ACT nasal spray Place 1 spray into alternate nostrils daily.   Cholecalciferol (VITAMIN D) 2000 UNITS CAPS Take 1 capsule by mouth.   ciprofloxacin (CIPRO) 500 MG tablet Take 1 tablet (500 mg total) by mouth 2 (two) times daily for 5 days.   lisinopril (ZESTRIL) 20 MG tablet Take 1 tablet by mouth once daily   Multiple Vitamin (MULTIVITAMIN) tablet Take 1  tablet by mouth daily.   NIFEdipine (PROCARDIA XL/NIFEDICAL XL) 60 MG 24 hr tablet Take 1 tablet (60 mg total) by mouth daily.   No facility-administered encounter medications on file as of 02/18/2021.    No Known Allergies  Review of Systems  Constitutional:  Negative for activity change, appetite change, chills, diaphoresis, fatigue, fever and unexpected weight change.  HENT: Negative.    Eyes: Negative.   Respiratory:  Negative for cough, chest tightness and shortness of breath.   Cardiovascular:  Negative for chest pain, palpitations and leg swelling.  Gastrointestinal:  Negative for abdominal pain, blood in stool, constipation, diarrhea, nausea and vomiting.  Endocrine: Negative.   Genitourinary:  Positive for dysuria, frequency and urgency. Negative for decreased urine volume, difficulty urinating, enuresis, flank pain, hematuria, hesitancy, pelvic pain, vaginal bleeding, vaginal discharge and vaginal pain.  Musculoskeletal:  Negative for arthralgias and myalgias.  Skin: Negative.   Allergic/Immunologic: Negative.   Neurological:  Negative for dizziness, weakness and headaches.  Hematological: Negative.   Psychiatric/Behavioral:  Negative for confusion, hallucinations, sleep disturbance and suicidal ideas.   All other systems reviewed and are negative.      Objective:  BP 140/77   Pulse 83   Temp 98.1 F (36.7 C)   Ht 5\' 1"  (1.549 m)   Wt 101 lb (45.8 kg)   SpO2 100%   BMI 19.08 kg/m    Wt Readings from Last 3 Encounters:  02/18/21 101 lb (45.8 kg)  01/23/21 98 lb 6.4 oz (44.6 kg)  10/30/20 98 lb 3.2 oz (44.5 kg)    Physical Exam Vitals and nursing note reviewed.  Constitutional:      General: She is not in acute distress.    Appearance: Normal appearance. She is well-developed, well-groomed and normal weight. She is not ill-appearing, toxic-appearing or diaphoretic.  HENT:     Head: Normocephalic and atraumatic.     Jaw: There is normal jaw occlusion.      Right Ear: Hearing normal.     Left Ear: Hearing normal.     Nose: Nose normal.     Mouth/Throat:     Lips: Pink.     Mouth: Mucous membranes are moist.     Pharynx: Oropharynx is clear. Uvula midline.  Eyes:     General: Lids are normal.     Extraocular Movements: Extraocular movements intact.     Conjunctiva/sclera: Conjunctivae normal.     Pupils: Pupils are equal, round, and reactive to light.  Neck:     Thyroid: No thyroid mass, thyromegaly or thyroid tenderness.     Vascular: No carotid bruit or JVD.     Trachea: Trachea and phonation normal.  Cardiovascular:     Rate and Rhythm: Normal rate and regular rhythm.     Chest Wall: PMI is not displaced.     Pulses: Normal pulses.  Heart sounds: Normal heart sounds. No murmur heard.   No friction rub. No gallop.  Pulmonary:     Effort: Pulmonary effort is normal. No respiratory distress.     Breath sounds: Normal breath sounds. No wheezing.  Abdominal:     General: Bowel sounds are normal. There is no distension or abdominal bruit.     Palpations: Abdomen is soft. There is no hepatomegaly or splenomegaly.     Tenderness: There is no abdominal tenderness. There is no right CVA tenderness or left CVA tenderness.     Hernia: No hernia is present.  Musculoskeletal:        General: Normal range of motion.     Cervical back: Normal range of motion and neck supple.     Right lower leg: No edema.     Left lower leg: No edema.  Lymphadenopathy:     Cervical: No cervical adenopathy.  Skin:    General: Skin is warm and dry.     Capillary Refill: Capillary refill takes less than 2 seconds.     Coloration: Skin is not cyanotic, jaundiced or pale.     Findings: No rash.  Neurological:     General: No focal deficit present.     Mental Status: She is alert and oriented to person, place, and time.     Motor: Motor function is intact. No weakness.     Coordination: Coordination is intact.     Gait: Gait normal.  Psychiatric:         Attention and Perception: Attention and perception normal.        Mood and Affect: Mood and affect normal.        Speech: Speech normal.        Behavior: Behavior normal. Behavior is cooperative.        Thought Content: Thought content normal.        Cognition and Memory: Cognition and memory normal.        Judgment: Judgment normal.    Results for orders placed or performed in visit on 01/23/21  Urine Culture   Specimen: Urine   UR  Result Value Ref Range   Urine Culture, Routine Final report (A)    Organism ID, Bacteria Escherichia coli (A)    Antimicrobial Susceptibility Comment   Microscopic Examination   Urine  Result Value Ref Range   WBC, UA >30 (A) 0 - 5 /hpf   RBC 0-2 0 - 2 /hpf   Epithelial Cells (non renal) 0-10 0 - 10 /hpf   Renal Epithel, UA None seen None seen /hpf   Bacteria, UA Moderate (A) None seen/Few  Urinalysis, Routine w reflex microscopic  Result Value Ref Range   Specific Gravity, UA 1.010 1.005 - 1.030   pH, UA 5.5 5.0 - 7.5   Color, UA Yellow Yellow   Appearance Ur Clear Clear   Leukocytes,UA 3+ (A) Negative   Protein,UA Negative Negative/Trace   Glucose, UA Negative Negative   Ketones, UA Negative Negative   RBC, UA Trace (A) Negative   Bilirubin, UA Negative Negative   Urobilinogen, Ur 0.2 0.2 - 1.0 mg/dL   Nitrite, UA Negative Negative   Microscopic Examination See below:      Urinalysis in office: moderate bacteria, 2+ leukocytes, negative blood  Pertinent labs & imaging results that were available during my care of the patient were reviewed by me and considered in my medical decision making.  Assessment & Plan:  Jariana was seen today for urinary  tract infection.  Diagnoses and all orders for this visit:  Dysuria Urinalysis as noted. Culture pending. Prior culture reviewed.  -     Urinalysis, Routine w reflex microscopic -     Urine Culture  Recurrent UTI Urinalysis as noted. Prior culture reviewed. Was previously on Bactrim for UTI,  will initiate Cipro today. Symptomatic care discussed in detail. Pt aware of red flags which require emergent evaluation and treatment. No indications of acute pyelonephritis. Medications as prescribed. Increase water intake. Recheck urinalysis in 2 weeks, if infection still present, will refer to urology.  -     ciprofloxacin (CIPRO) 500 MG tablet; Take 1 tablet (500 mg total) by mouth 2 (two) times daily for 5 days.     Continue all other maintenance medications.  Follow up plan: Return in about 2 weeks (around 03/04/2021), or if symptoms worsen or fail to improve, for repeat urinalysis.   Continue healthy lifestyle choices, including diet (rich in fruits, vegetables, and lean proteins, and low in salt and simple carbohydrates) and exercise (at least 30 minutes of moderate physical activity daily).  Educational handout given for UTI  The above assessment and management plan was discussed with the patient. The patient verbalized understanding of and has agreed to the management plan. Patient is aware to call the clinic if they develop any new symptoms or if symptoms persist or worsen. Patient is aware when to return to the clinic for a follow-up visit. Patient educated on when it is appropriate to go to the emergency department.   Kari Baars, FNP-C [facility] [phone]

## 2021-02-24 LAB — URINE CULTURE

## 2021-05-06 ENCOUNTER — Encounter: Payer: Self-pay | Admitting: Family Medicine

## 2021-05-06 ENCOUNTER — Ambulatory Visit (INDEPENDENT_AMBULATORY_CARE_PROVIDER_SITE_OTHER): Payer: Medicare Other | Admitting: Family Medicine

## 2021-05-06 VITALS — BP 138/76 | HR 76 | Temp 97.8°F | Ht 61.0 in | Wt 100.0 lb

## 2021-05-06 DIAGNOSIS — I10 Essential (primary) hypertension: Secondary | ICD-10-CM | POA: Diagnosis not present

## 2021-05-06 DIAGNOSIS — E782 Mixed hyperlipidemia: Secondary | ICD-10-CM

## 2021-05-06 DIAGNOSIS — Z23 Encounter for immunization: Secondary | ICD-10-CM

## 2021-05-06 DIAGNOSIS — K5901 Slow transit constipation: Secondary | ICD-10-CM | POA: Diagnosis not present

## 2021-05-06 LAB — CBC WITH DIFFERENTIAL/PLATELET
Basophils Absolute: 0 10*3/uL (ref 0.0–0.2)
Basos: 1 %
EOS (ABSOLUTE): 0.2 10*3/uL (ref 0.0–0.4)
Eos: 4 %
Hematocrit: 35.3 % (ref 34.0–46.6)
Hemoglobin: 11.4 g/dL (ref 11.1–15.9)
Immature Grans (Abs): 0 10*3/uL (ref 0.0–0.1)
Immature Granulocytes: 0 %
Lymphocytes Absolute: 1.4 10*3/uL (ref 0.7–3.1)
Lymphs: 28 %
MCH: 31.8 pg (ref 26.6–33.0)
MCHC: 32.3 g/dL (ref 31.5–35.7)
MCV: 99 fL — ABNORMAL HIGH (ref 79–97)
Monocytes Absolute: 0.5 10*3/uL (ref 0.1–0.9)
Monocytes: 9 %
Neutrophils Absolute: 3 10*3/uL (ref 1.4–7.0)
Neutrophils: 58 %
Platelets: 313 10*3/uL (ref 150–450)
RBC: 3.58 x10E6/uL — ABNORMAL LOW (ref 3.77–5.28)
RDW: 12.6 % (ref 11.7–15.4)
WBC: 5.1 10*3/uL (ref 3.4–10.8)

## 2021-05-06 LAB — CMP14+EGFR
ALT: 7 IU/L (ref 0–32)
AST: 14 IU/L (ref 0–40)
Albumin/Globulin Ratio: 1.6 (ref 1.2–2.2)
Albumin: 4.2 g/dL (ref 3.6–4.6)
Alkaline Phosphatase: 60 IU/L (ref 44–121)
BUN/Creatinine Ratio: 20 (ref 12–28)
BUN: 33 mg/dL — ABNORMAL HIGH (ref 8–27)
Bilirubin Total: 0.3 mg/dL (ref 0.0–1.2)
CO2: 20 mmol/L (ref 20–29)
Calcium: 9.7 mg/dL (ref 8.7–10.3)
Chloride: 107 mmol/L — ABNORMAL HIGH (ref 96–106)
Creatinine, Ser: 1.62 mg/dL — ABNORMAL HIGH (ref 0.57–1.00)
Globulin, Total: 2.6 g/dL (ref 1.5–4.5)
Glucose: 91 mg/dL (ref 70–99)
Potassium: 4.6 mmol/L (ref 3.5–5.2)
Sodium: 141 mmol/L (ref 134–144)
Total Protein: 6.8 g/dL (ref 6.0–8.5)
eGFR: 32 mL/min/{1.73_m2} — ABNORMAL LOW (ref >59)

## 2021-05-06 LAB — LIPID PANEL
Chol/HDL Ratio: 3.1 ratio (ref 0.0–4.4)
Cholesterol, Total: 170 mg/dL (ref 100–199)
HDL: 54 mg/dL (ref >39)
LDL Chol Calc (NIH): 98 mg/dL (ref 0–99)
Triglycerides: 101 mg/dL (ref 0–149)
VLDL Cholesterol Cal: 18 mg/dL (ref 5–40)

## 2021-05-06 MED ORDER — ALENDRONATE SODIUM 70 MG PO TABS: 70.0000 mg | ORAL_TABLET | ORAL | 11 refills | Status: DC

## 2021-05-06 MED ORDER — LISINOPRIL 20 MG PO TABS: 20.0000 mg | ORAL_TABLET | Freq: Every day | ORAL | 3 refills | Status: DC

## 2021-05-06 MED ORDER — DOCUSATE SODIUM 100 MG PO CAPS: 100.0000 mg | ORAL_CAPSULE | Freq: Two times a day (BID) | ORAL | 5 refills | Status: DC

## 2021-05-06 NOTE — Progress Notes (Signed)
Subjective:  Patient ID: Tamara Gross, female    DOB: 1940/04/22  Age: 81 y.o. MRN: [id number]  CC: Medical Management of Chronic Issues   HPI Tamara Gross presents for  follow-up of hypertension. Patient has no history of headache chest pain or shortness of breath or recent cough. Patient also denies symptoms of TIA such as focal numbness or weakness. Patient denies side effects from medication. States taking it regularly.   History Tamara Gross has a past medical history of Gout, Hypertension, and Osteoporosis.   She has a past surgical history that includes Tonsillectomy.   Her family history includes Cancer in her mother; Deep vein thrombosis in her father; Hypertension in her brother and sister.She reports that she has never smoked. She has never used smokeless tobacco. She reports that she does not drink alcohol and does not use drugs.  Current Outpatient Medications on File Prior to Visit  Medication Sig Dispense Refill   allopurinol (ZYLOPRIM) 100 MG tablet Take 1 tablet (100 mg total) by mouth daily. 90 tablet 3   calcitonin, salmon, (MIACALCIN/FORTICAL) 200 UNIT/ACT nasal spray Place 1 spray into alternate nostrils daily. 3.7 mL 12   Cholecalciferol (VITAMIN D) 2000 UNITS CAPS Take 1 capsule by mouth.     Multiple Vitamin (MULTIVITAMIN) tablet Take 1 tablet by mouth daily.     NIFEdipine (PROCARDIA XL/NIFEDICAL XL) 60 MG 24 hr tablet Take 1 tablet (60 mg total) by mouth daily. 90 tablet 2   No current facility-administered medications on file prior to visit.    ROS Review of Systems  Constitutional: Negative.   HENT: Negative.    Eyes:  Negative for visual disturbance.  Respiratory:  Negative for shortness of breath.   Cardiovascular:  Negative for chest pain.  Gastrointestinal:  Positive for constipation. Negative for abdominal pain.  Musculoskeletal:  Negative for arthralgias.   Objective:  BP 138/76    Pulse 76    Temp 97.8 F (36.6 C)    Ht 5' 1" (1.549 m)    Wt 100  lb (45.4 kg)    SpO2 96%    BMI 18.89 kg/m   BP Readings from Last 3 Encounters:  05/06/21 138/76  02/18/21 140/77  01/23/21 (!) 145/76    Wt Readings from Last 3 Encounters:  05/06/21 100 lb (45.4 kg)  02/18/21 101 lb (45.8 kg)  01/23/21 98 lb 6.4 oz (44.6 kg)     Physical Exam Constitutional:      General: She is not in acute distress.    Appearance: She is well-developed.  HENT:     Head: Normocephalic and atraumatic.     Right Ear: External ear normal.     Left Ear: External ear normal.     Nose: Nose normal.  Eyes:     Conjunctiva/sclera: Conjunctivae normal.     Pupils: Pupils are equal, round, and reactive to light.  Neck:     Thyroid: No thyromegaly.  Cardiovascular:     Rate and Rhythm: Normal rate and regular rhythm.     Heart sounds: Normal heart sounds. No murmur heard. Pulmonary:     Effort: Pulmonary effort is normal. No respiratory distress.     Breath sounds: Normal breath sounds. No wheezing or rales.  Abdominal:     General: Bowel sounds are normal. There is no distension.     Palpations: Abdomen is soft.     Tenderness: There is no abdominal tenderness.  Musculoskeletal:     Cervical back: Normal range  of motion and neck supple.  Lymphadenopathy:     Cervical: No cervical adenopathy.  Skin:    General: Skin is warm and dry.  Neurological:     Mental Status: She is alert and oriented to person, place, and time.     Deep Tendon Reflexes: Reflexes are normal and symmetric.  Psychiatric:        Behavior: Behavior normal.        Thought Content: Thought content normal.        Judgment: Judgment normal.      Assessment & Plan:   Tamara Gross was seen today for medical management of chronic issues.  Diagnoses and all orders for this visit:  Primary hypertension -     CBC with Differential/Platelet -     CMP14+EGFR  Mixed hyperlipidemia -     Lipid panel  Essential hypertension -     lisinopril (ZESTRIL) 20 MG tablet; Take 1 tablet (20 mg  total) by mouth daily.  Slow transit constipation  Other orders -     alendronate (FOSAMAX) 70 MG tablet; Take 1 tablet (70 mg total) by mouth every 7 (seven) days. Take with a full glass of water on an empty stomach. Do not lay down for at least 2 hours -     docusate sodium (COLACE) 100 MG capsule; Take 1 capsule (100 mg total) by mouth 2 (two) times daily. To soften stool   Allergies as of 05/06/2021   No Known Allergies      Medication List        Accurate as of May 06, 2021  9:21 AM. If you have any questions, ask your nurse or doctor.          alendronate 70 MG tablet Commonly known as: FOSAMAX Take 1 tablet (70 mg total) by mouth every 7 (seven) days. Take with a full glass of water on an empty stomach. Do not lay down for at least 2 hours   allopurinol 100 MG tablet Commonly known as: ZYLOPRIM Take 1 tablet (100 mg total) by mouth daily.   calcitonin (salmon) 200 UNIT/ACT nasal spray Commonly known as: MIACALCIN/FORTICAL Place 1 spray into alternate nostrils daily.   docusate sodium 100 MG capsule Commonly known as: Colace Take 1 capsule (100 mg total) by mouth 2 (two) times daily. To soften stool Started by: Claretta Fraise, MD   lisinopril 20 MG tablet Commonly known as: ZESTRIL Take 1 tablet (20 mg total) by mouth daily.   multivitamin tablet Take 1 tablet by mouth daily.   NIFEdipine 60 MG 24 hr tablet Commonly known as: PROCARDIA XL/NIFEDICAL XL Take 1 tablet (60 mg total) by mouth daily.   Vitamin D 50 MCG (2000 UT) Caps Take 1 capsule by mouth.        Meds ordered this encounter  Medications   alendronate (FOSAMAX) 70 MG tablet    Sig: Take 1 tablet (70 mg total) by mouth every 7 (seven) days. Take with a full glass of water on an empty stomach. Do not lay down for at least 2 hours    Dispense:  4 tablet    Refill:  11   lisinopril (ZESTRIL) 20 MG tablet    Sig: Take 1 tablet (20 mg total) by mouth daily.    Dispense:  90 tablet     Refill:  3   docusate sodium (COLACE) 100 MG capsule    Sig: Take 1 capsule (100 mg total) by mouth 2 (two) times daily. To soften  stool    Dispense:  60 capsule    Refill:  5      Follow-up: Return in about 6 months (around 11/03/2021).  Claretta Fraise, M.D.

## 2021-05-06 NOTE — Addendum Note (Signed)
Addended by: Adella Hare B on: 05/06/2021 04:20 PM   Modules accepted: Orders

## 2021-06-04 ENCOUNTER — Encounter: Payer: Self-pay | Admitting: *Deleted

## 2021-07-08 NOTE — Patient Instructions (Signed)
Tamara Gross , ?Thank you for taking time to come for your Medicare Wellness Visit. I appreciate your ongoing commitment to your health goals. Please review the following plan we discussed and let me know if I can assist you in the future.  ? ?Screening recommendations/referrals: ?Colonoscopy: Done 06/02/2010 - no repeat required ?Mammogram: No longer required ?Bone Density: Done 05/02/2020 - Repeat every 2 years ?Recommended yearly ophthalmology/optometry visit for glaucoma screening and checkup ?Recommended yearly dental visit for hygiene and checkup ? ?Vaccinations: ?Influenza vaccine: Done 01/23/21 - Repeat annually ?Pneumococcal vaccine: Done 07/29/11 & 11/05/14 ?Tdap vaccine: Done 05/07/2014 - Repeat in 10 years ?Shingles vaccine: Done 10/30/2020 & 05/06/2021   ?Covid-19: Done 07/27/2019, 08/18/2019, 02/20/2020 ? ?Advanced directives: Please bring a copy of your health care power of attorney and living will to the office to be added to your chart at your convenience.  ? ?Conditions/risks identified: Aim for 30 minutes of exercise or brisk walking, 6-8 glasses of water, and 5 servings of fruits and vegetables each day.  ? ?Next appointment: Follow up in one year for your annual wellness visit  ? ? ?Preventive Care 48 Years and Older, Female ?Preventive care refers to lifestyle choices and visits with your health care provider that can promote health and wellness. ?What does preventive care include? ?A yearly physical exam. This is also called an annual well check. ?Dental exams once or twice a year. ?Routine eye exams. Ask your health care provider how often you should have your eyes checked. ?Personal lifestyle choices, including: ?Daily care of your teeth and gums. ?Regular physical activity. ?Eating a healthy diet. ?Avoiding tobacco and drug use. ?Limiting alcohol use. ?Practicing safe sex. ?Taking low-dose aspirin every day. ?Taking vitamin and mineral supplements as recommended by your health care provider. ?What  happens during an annual well check? ?The services and screenings done by your health care provider during your annual well check will depend on your age, overall health, lifestyle risk factors, and family history of disease. ?Counseling  ?Your health care provider may ask you questions about your: ?Alcohol use. ?Tobacco use. ?Drug use. ?Emotional well-being. ?Home and relationship well-being. ?Sexual activity. ?Eating habits. ?History of falls. ?Memory and ability to understand (cognition). ?Work and work Astronomer. ?Reproductive health. ?Screening  ?You may have the following tests or measurements: ?Height, weight, and BMI. ?Blood pressure. ?Lipid and cholesterol levels. These may be checked every 5 years, or more frequently if you are over 62 years old. ?Skin check. ?Lung cancer screening. You may have this screening every year starting at age 9 if you have a 30-pack-year history of smoking and currently smoke or have quit within the past 15 years. ?Fecal occult blood test (FOBT) of the stool. You may have this test every year starting at age 93. ?Flexible sigmoidoscopy or colonoscopy. You may have a sigmoidoscopy every 5 years or a colonoscopy every 10 years starting at age 60. ?Hepatitis C blood test. ?Hepatitis B blood test. ?Sexually transmitted disease (STD) testing. ?Diabetes screening. This is done by checking your blood sugar (glucose) after you have not eaten for a while (fasting). You may have this done every 1-3 years. ?Bone density scan. This is done to screen for osteoporosis. You may have this done starting at age 49. ?Mammogram. This may be done every 1-2 years. Talk to your health care provider about how often you should have regular mammograms. ?Talk with your health care provider about your test results, treatment options, and if necessary, the need for more  tests. ?Vaccines  ?Your health care provider may recommend certain vaccines, such as: ?Influenza vaccine. This is recommended every  year. ?Tetanus, diphtheria, and acellular pertussis (Tdap, Td) vaccine. You may need a Td booster every 10 years. ?Zoster vaccine. You may need this after age 76. ?Pneumococcal 13-valent conjugate (PCV13) vaccine. One dose is recommended after age 30. ?Pneumococcal polysaccharide (PPSV23) vaccine. One dose is recommended after age 73. ?Talk to your health care provider about which screenings and vaccines you need and how often you need them. ?This information is not intended to replace advice given to you by your health care provider. Make sure you discuss any questions you have with your health care provider. ?Document Released: 05/03/2015 Document Revised: 12/25/2015 Document Reviewed: 02/05/2015 ?Elsevier Interactive Patient Education ? 2017 [place]. ? ?Fall Prevention in the Home ?Falls can cause injuries. They can happen to people of all ages. There are many things you can do to make your home safe and to help prevent falls. ?What can I do on the outside of my home? ?Regularly fix the edges of walkways and driveways and fix any cracks. ?Remove anything that might make you trip as you walk through a door, such as a raised step or threshold. ?Trim any bushes or trees on the path to your home. ?Use bright outdoor lighting. ?Clear any walking paths of anything that might make someone trip, such as rocks or tools. ?Regularly check to see if handrails are loose or broken. Make sure that both sides of any steps have handrails. ?Any raised decks and porches should have guardrails on the edges. ?Have any leaves, snow, or ice cleared regularly. ?Use sand or salt on walking paths during winter. ?Clean up any spills in your garage right away. This includes oil or grease spills. ?What can I do in the bathroom? ?Use night lights. ?Install grab bars by the toilet and in the tub and shower. Do not use towel bars as grab bars. ?Use non-skid mats or decals in the tub or shower. ?If you need to sit down in the shower, use a  plastic, non-slip stool. ?Keep the floor dry. Clean up any water that spills on the floor as soon as it happens. ?Remove soap buildup in the tub or shower regularly. ?Attach bath mats securely with double-sided non-slip rug tape. ?Do not have throw rugs and other things on the floor that can make you trip. ?What can I do in the bedroom? ?Use night lights. ?Make sure that you have a light by your bed that is easy to reach. ?Do not use any sheets or blankets that are too big for your bed. They should not hang down onto the floor. ?Have a firm chair that has side arms. You can use this for support while you get dressed. ?Do not have throw rugs and other things on the floor that can make you trip. ?What can I do in the kitchen? ?Clean up any spills right away. ?Avoid walking on wet floors. ?Keep items that you use a lot in easy-to-reach places. ?If you need to reach something above you, use a strong step stool that has a grab bar. ?Keep electrical cords out of the way. ?Do not use floor polish or wax that makes floors slippery. If you must use wax, use non-skid floor wax. ?Do not have throw rugs and other things on the floor that can make you trip. ?What can I do with my stairs? ?Do not leave any items on the stairs. ?Make sure  that there are handrails on both sides of the stairs and use them. Fix handrails that are broken or loose. Make sure that handrails are as long as the stairways. ?Check any carpeting to make sure that it is firmly attached to the stairs. Fix any carpet that is loose or worn. ?Avoid having throw rugs at the top or bottom of the stairs. If you do have throw rugs, attach them to the floor with carpet tape. ?Make sure that you have a light switch at the top of the stairs and the bottom of the stairs. If you do not have them, ask someone to add them for you. ?What else can I do to help prevent falls? ?Wear shoes that: ?Do not have high heels. ?Have rubber bottoms. ?Are comfortable and fit you  well. ?Are closed at the toe. Do not wear sandals. ?If you use a stepladder: ?Make sure that it is fully opened. Do not climb a closed stepladder. ?Make sure that both sides of the stepladder are locked into place. ?As

## 2021-07-09 ENCOUNTER — Ambulatory Visit (INDEPENDENT_AMBULATORY_CARE_PROVIDER_SITE_OTHER): Payer: Medicare Other

## 2021-07-09 VITALS — Wt 100.0 lb

## 2021-07-09 DIAGNOSIS — Z0000 Encounter for general adult medical examination without abnormal findings: Secondary | ICD-10-CM | POA: Diagnosis not present

## 2021-07-09 NOTE — Progress Notes (Signed)
? ?Subjective:  ? Tamara Gross is a 81 y.o. female who presents for Medicare Annual (Subsequent) preventive examination. ? ?Virtual Visit via Telephone Note ? ?I connected with  Tamara Gross on 07/09/21 at  9:00 AM EDT by telephone and verified that I am speaking with the correct person using two identifiers. ? ?Location: ?Patient: Home ?Provider: [facility] ?Persons participating in the virtual visit: patient/Nurse Health Advisor ?  ?I discussed the limitations, risks, security and privacy concerns of performing an evaluation and management service by telephone and the availability of in person appointments. The patient expressed understanding and agreed to proceed. ? ?Interactive audio and video telecommunications were attempted between this nurse and patient, however failed, due to patient having technical difficulties OR patient did not have access to video capability.  We continued and completed visit with audio only. ? ?Some vital signs may be absent or patient reported.  ? ?Rosalie Gelpi Dionne Ano, LPN  ? ?Review of Systems    ? ?Cardiac Risk Factors include: advanced age (>63men, >46 women);hypertension;sedentary lifestyle ? ?   ?Objective:  ?  ?Today's Vitals  ? 07/09/21 0901  ?Weight: 100 lb (45.4 kg)  ? ?Body mass index is 18.89 kg/m?. ? ? ?  07/09/2021  ?  9:39 AM 11/11/2020  ?  9:34 AM 07/08/2020  ?  2:01 PM 06/23/2019  ? 11:10 AM  ?Advanced Directives  ?Does Patient Have a Medical Advance Directive? No No No No  ?Would patient like information on creating a medical advance directive? No - Patient declined  No - Patient declined No - Patient declined  ? ? ?Current Medications (verified) ?Outpatient Encounter Medications as of 07/09/2021  ?Medication Sig  ? alendronate (FOSAMAX) 70 MG tablet Take 1 tablet (70 mg total) by mouth every 7 (seven) days. Take with a full glass of water on an empty stomach. Do not lay down for at least 2 hours  ? allopurinol (ZYLOPRIM) 100 MG tablet Take 1 tablet (100 mg total) by mouth daily.   ? Cholecalciferol (VITAMIN D) 2000 UNITS CAPS Take 1 capsule by mouth.  ? docusate sodium (COLACE) 100 MG capsule Take 1 capsule (100 mg total) by mouth 2 (two) times daily. To soften stool  ? lisinopril (ZESTRIL) 20 MG tablet Take 1 tablet (20 mg total) by mouth daily.  ? Multiple Vitamin (MULTIVITAMIN) tablet Take 1 tablet by mouth daily.  ? NIFEdipine (PROCARDIA XL/NIFEDICAL XL) 60 MG 24 hr tablet Take 1 tablet (60 mg total) by mouth daily.  ? calcitonin, salmon, (MIACALCIN/FORTICAL) 200 UNIT/ACT nasal spray Place 1 spray into alternate nostrils daily. (Patient not taking: Reported on 07/09/2021)  ? ?No facility-administered encounter medications on file as of 07/09/2021.  ? ? ?Allergies (verified) ?Patient has no known allergies.  ? ?History: ?Past Medical History:  ?Diagnosis Date  ? Gout   ? Hypertension   ? Osteoporosis   ? ?Past Surgical History:  ?Procedure Laterality Date  ? TONSILLECTOMY    ? ?Family History  ?Problem Relation Age of Onset  ? Cancer Mother   ? Deep vein thrombosis Father   ? Hypertension Sister   ? Hypertension Brother   ? ?Social History  ? ?Socioeconomic History  ? Marital status: Widowed  ?  Spouse name: Not on file  ? Number of children: 0  ? Years of education: college  ? Highest education level: Associate degree: academic program  ?Occupational History  ? Occupation: Retired  ?Tobacco Use  ? Smoking status: Never  ? Smokeless tobacco:  Never  ?Vaping Use  ? Vaping Use: Never used  ?Substance and Sexual Activity  ? Alcohol use: Never  ? Drug use: Never  ? Sexual activity: Not Currently  ?Other Topics Concern  ? Not on file  ?Social History Narrative  ? Sister recently had stroke - she is helping care for her   ? ?Social Determinants of Health  ? ?Financial Resource Strain: Low Risk   ? Difficulty of Paying Living Expenses: Not hard at all  ?Food Insecurity: No Food Insecurity  ? Worried About Charity fundraiser in the Last Year: Never true  ? Ran Out of Food in the Last Year: Never  true  ?Transportation Needs: No Transportation Needs  ? Lack of Transportation (Medical): No  ? Lack of Transportation (Non-Medical): No  ?Physical Activity: Insufficiently Active  ? Days of Exercise per Week: 7 days  ? Minutes of Exercise per Session: 20 min  ?Stress: No Stress Concern Present  ? Feeling of Stress : Only a little  ?Social Connections: Moderately Integrated  ? Frequency of Communication with Friends and Family: More than three times a week  ? Frequency of Social Gatherings with Friends and Family: More than three times a week  ? Attends Religious Services: More than 4 times per year  ? Active Member of Clubs or Organizations: Yes  ? Attends Archivist Meetings: More than 4 times per year  ? Marital Status: Widowed  ? ? ?Tobacco Counseling ?Counseling given: Not Answered ? ? ?Clinical Intake: ? ?Pre-visit preparation completed: Yes ? ?Pain : No/denies pain ? ?  ? ?BMI - recorded: 18.89 ?Nutritional Status: BMI <19  Underweight ?Nutritional Risks: None ?Diabetes: No ? ?How often do you need to have someone help you when you read instructions, pamphlets, or other written materials from your doctor or pharmacy?: 1 - Never ? ?Diabetic? no ? ?Interpreter Needed?: No ? ?Information entered by :: Fitzgerald Dunne, LPN ? ? ?Activities of Daily Living ? ?  07/09/2021  ?  9:39 AM  ?In your present state of health, do you have any difficulty performing the following activities:  ?Hearing? 0  ?Vision? 0  ?Difficulty concentrating or making decisions? 0  ?Walking or climbing stairs? 0  ?Dressing or bathing? 0  ?Doing errands, shopping? 0  ?Preparing Food and eating ? N  ?Using the Toilet? N  ?In the past six months, have you accidently leaked urine? Y  ?Comment mild -wears pads in public only  ?Do you have problems with loss of bowel control? N  ?Managing your Medications? N  ?Managing your Finances? N  ?Housekeeping or managing your Housekeeping? N  ? ? ?Patient Care Team: ?Claretta Fraise, MD as PCP -  General (Family Medicine) ? ?Indicate any recent Medical Services you may have received from other than [facility] providers in the past year (date may be approximate). ? ?   ?Assessment:  ? This is a routine wellness examination for Tamara Gross. ? ?Hearing/Vision screen ?Hearing Screening - Comments:: Denies hearing difficulties   ?Vision Screening - Comments:: Wears rx glasses for reading only prn - no optometrist at this time - behind on routine eye exams ? ?Dietary issues and exercise activities discussed: ?Current Exercise Habits: Home exercise routine, Type of exercise: walking, Time (Minutes): 20, Frequency (Times/Week): 7, Weekly Exercise (Minutes/Week): 140, Intensity: Mild, Exercise limited by: None identified ? ? Goals Addressed   ? ?  ?  ?  ?  ?  ? This Visit's Progress  ?  Patient Stated     ?   I would like to stay healthy so I can continue helping care for my brother and sister, and I hope to eventually be able to travel more. ?  ?   Prevent falls (pt-stated)   On track  ? ?  ? ?Depression Screen ? ?  07/09/2021  ?  9:05 AM 05/06/2021  ?  8:56 AM 02/18/2021  ? 11:07 AM 01/23/2021  ? 10:33 AM 10/30/2020  ?  8:51 AM 07/08/2020  ?  2:01 PM 05/02/2020  ?  9:28 AM  ?PHQ 2/9 Scores  ?PHQ - 2 Score 0 0 0 0 0 0 0  ?PHQ- 9 Score   0 0     ?  ?Fall Risk ? ?  07/09/2021  ?  9:02 AM 05/06/2021  ?  8:56 AM 02/18/2021  ? 11:07 AM 01/23/2021  ? 10:33 AM 10/30/2020  ?  8:51 AM  ?Fall Risk   ?Falls in the past year? 0 0 0 0 0  ?Number falls in past yr: 0      ?Injury with Fall? 0      ?Risk for fall due to : No Fall Risks      ?Follow up Falls prevention discussed      ? ? ?FALL RISK PREVENTION PERTAINING TO THE HOME: ? ?Any stairs in or around the home? Yes  ?If so, are there any without handrails? No  ?Home free of loose throw rugs in walkways, pet beds, electrical cords, etc? Yes  ?Adequate lighting in your home to reduce risk of falls? Yes  ? ?ASSISTIVE DEVICES UTILIZED TO PREVENT FALLS: ? ?Life alert? No  ?Use of a cane, walker or w/c?  No  ?Grab bars in the bathroom? Yes  ?Shower chair or bench in shower? Yes  ?Elevated toilet seat or a handicapped toilet? Yes  ? ?TIMED UP AND GO: ? ?Was the test performed? No . Telephonic visit ? ?Cognitive

## 2021-10-07 ENCOUNTER — Other Ambulatory Visit: Payer: Self-pay | Admitting: Family Medicine

## 2021-10-07 DIAGNOSIS — I10 Essential (primary) hypertension: Secondary | ICD-10-CM

## 2021-10-28 ENCOUNTER — Encounter: Payer: Self-pay | Admitting: Family Medicine

## 2021-10-28 ENCOUNTER — Ambulatory Visit (INDEPENDENT_AMBULATORY_CARE_PROVIDER_SITE_OTHER): Payer: Medicare Other | Admitting: Family Medicine

## 2021-10-28 VITALS — BP 124/74 | HR 83 | Temp 97.7°F | Ht 61.0 in | Wt 102.2 lb

## 2021-10-28 DIAGNOSIS — M1000 Idiopathic gout, unspecified site: Secondary | ICD-10-CM

## 2021-10-28 DIAGNOSIS — E782 Mixed hyperlipidemia: Secondary | ICD-10-CM | POA: Diagnosis not present

## 2021-10-28 DIAGNOSIS — I10 Essential (primary) hypertension: Secondary | ICD-10-CM | POA: Diagnosis not present

## 2021-10-28 MED ORDER — DOCUSATE SODIUM 100 MG PO CAPS
100.0000 mg | ORAL_CAPSULE | Freq: Two times a day (BID) | ORAL | 5 refills | Status: DC
Start: 2021-10-28 — End: 2023-06-02

## 2021-10-28 MED ORDER — ALLOPURINOL 100 MG PO TABS: 100.0000 mg | ORAL_TABLET | Freq: Every day | ORAL | 3 refills | Status: DC

## 2021-10-28 NOTE — Progress Notes (Signed)
Subjective:  Patient ID: Tamara Gross, female    DOB: 1940-12-13  Age: 81 y.o. MRN: [id number]  CC: Medical Management of Chronic Issues   HPI Katelin B Hornbeck presents for  follow-up of hypertension. Patient has no history of headache chest pain or shortness of breath or recent cough. Patient also denies symptoms of TIA such as focal numbness or weakness. Patient denies side effects from medication. States taking it regularly.  No recent gout flare.   Some constipation and gas. Still not using colace regularly   History Caydance has a past medical history of Gout, Hypertension, and Osteoporosis.   She has a past surgical history that includes Tonsillectomy.   Her family history includes Cancer in her mother; Deep vein thrombosis in her father; Hypertension in her brother and sister.She reports that she has never smoked. She has never used smokeless tobacco. She reports that she does not drink alcohol and does not use drugs.  Current Outpatient Medications on File Prior to Visit  Medication Sig Dispense Refill   alendronate (FOSAMAX) 70 MG tablet Take 1 tablet (70 mg total) by mouth every 7 (seven) days. Take with a full glass of water on an empty stomach. Do not lay down for at least 2 hours 4 tablet 11   Cholecalciferol (VITAMIN D) 2000 UNITS CAPS Take 1 capsule by mouth.     lisinopril (ZESTRIL) 20 MG tablet Take 1 tablet (20 mg total) by mouth daily. 90 tablet 3   Multiple Vitamin (MULTIVITAMIN) tablet Take 1 tablet by mouth daily.     NIFEdipine (PROCARDIA XL/NIFEDICAL XL) 60 MG 24 hr tablet Take 1 tablet by mouth once daily 90 tablet 0   No current facility-administered medications on file prior to visit.    ROS Review of Systems  Constitutional: Negative.   HENT: Negative.    Eyes:  Negative for visual disturbance.  Respiratory:  Negative for shortness of breath.   Cardiovascular:  Negative for chest pain.  Gastrointestinal:  Positive for constipation (using colace prn  only). Negative for abdominal pain.  Musculoskeletal:  Negative for arthralgias.    Objective:  BP 124/74   Pulse 83   Temp 97.7 F (36.5 C)   Ht _0  (1.549 m)   Wt 102 lb 3.2 oz (46.4 kg)   SpO2 97%   BMI 19.31 kg/m   BP Readings from Last 3 Encounters:  10/28/21 124/74  05/06/21 138/76  02/18/21 140/77    Wt Readings from Last 3 Encounters:  10/28/21 102 lb 3.2 oz (46.4 kg)  07/09/21 100 lb (45.4 kg)  05/06/21 100 lb (45.4 kg)     Physical Exam Constitutional:      General: She is not in acute distress.    Appearance: She is well-developed.  Cardiovascular:     Rate and Rhythm: Normal rate and regular rhythm.  Pulmonary:     Breath sounds: Normal breath sounds.  Musculoskeletal:        General: Normal range of motion.  Skin:    General: Skin is warm and dry.  Neurological:     Mental Status: She is alert and oriented to person, place, and time.       Assessment & Plan:   Alianys was seen today for medical management of chronic issues.  Diagnoses and all orders for this visit:  Primary hypertension -     CBC with Differential/Platelet -     CMP14+EGFR  Mixed hyperlipidemia -     Lipid panel  Idiopathic gout, unspecified chronicity, unspecified site -     allopurinol (ZYLOPRIM) 100 MG tablet; Take 1 tablet (100 mg total) by mouth daily. -     Uric acid  Other orders -     docusate sodium (COLACE) 100 MG capsule; Take 1 capsule (100 mg total) by mouth 2 (two) times daily. To soften stool   Allergies as of 10/28/2021   No Known Allergies      Medication List        Accurate as of October 28, 2021  9:20 AM. If you have any questions, ask your nurse or doctor.          STOP taking these medications    calcitonin (salmon) 200 UNIT/ACT nasal spray Commonly known as: MIACALCIN/FORTICAL Stopped by: Claretta Fraise, MD       TAKE these medications    alendronate 70 MG tablet Commonly known as: FOSAMAX Take 1 tablet (70 mg total) by mouth  every 7 (seven) days. Take with a full glass of water on an empty stomach. Do not lay down for at least 2 hours   allopurinol 100 MG tablet Commonly known as: ZYLOPRIM Take 1 tablet (100 mg total) by mouth daily.   docusate sodium 100 MG capsule Commonly known as: Colace Take 1 capsule (100 mg total) by mouth 2 (two) times daily. To soften stool   lisinopril 20 MG tablet Commonly known as: ZESTRIL Take 1 tablet (20 mg total) by mouth daily.   multivitamin tablet Take 1 tablet by mouth daily.   NIFEdipine 60 MG 24 hr tablet Commonly known as: PROCARDIA XL/NIFEDICAL XL Take 1 tablet by mouth once daily   Vitamin D 50 MCG (2000 UT) Caps Take 1 capsule by mouth.        Meds ordered this encounter  Medications   allopurinol (ZYLOPRIM) 100 MG tablet    Sig: Take 1 tablet (100 mg total) by mouth daily.    Dispense:  90 tablet    Refill:  3   docusate sodium (COLACE) 100 MG capsule    Sig: Take 1 capsule (100 mg total) by mouth 2 (two) times daily. To soften stool    Dispense:  60 capsule    Refill:  5      Follow-up: Return in about 6 months (around 04/30/2022).  Claretta Fraise, M.D.

## 2021-10-29 LAB — CBC WITH DIFFERENTIAL/PLATELET
Basophils Absolute: 0.1 10*3/uL (ref 0.0–0.2)
Basos: 1 %
EOS (ABSOLUTE): 0.2 10*3/uL (ref 0.0–0.4)
Eos: 3 %
Hematocrit: 32.1 % — ABNORMAL LOW (ref 34.0–46.6)
Hemoglobin: 10.6 g/dL — ABNORMAL LOW (ref 11.1–15.9)
Immature Grans (Abs): 0 10*3/uL (ref 0.0–0.1)
Immature Granulocytes: 0 %
Lymphocytes Absolute: 1.2 10*3/uL (ref 0.7–3.1)
Lymphs: 20 %
MCH: 31.8 pg (ref 26.6–33.0)
MCHC: 33 g/dL (ref 31.5–35.7)
MCV: 96 fL (ref 79–97)
Monocytes Absolute: 0.5 10*3/uL (ref 0.1–0.9)
Monocytes: 8 %
Neutrophils Absolute: 4.1 10*3/uL (ref 1.4–7.0)
Neutrophils: 68 %
Platelets: 321 10*3/uL (ref 150–450)
RBC: 3.33 x10E6/uL — ABNORMAL LOW (ref 3.77–5.28)
RDW: 12.6 % (ref 11.7–15.4)
WBC: 6.1 10*3/uL (ref 3.4–10.8)

## 2021-10-29 LAB — CMP14+EGFR
ALT: 6 IU/L (ref 0–32)
AST: 15 IU/L (ref 0–40)
Albumin/Globulin Ratio: 3.2 — ABNORMAL HIGH (ref 1.2–2.2)
Albumin: 5.4 g/dL — ABNORMAL HIGH (ref 3.7–4.7)
Alkaline Phosphatase: 56 IU/L (ref 44–121)
BUN/Creatinine Ratio: 18 (ref 12–28)
BUN: 29 mg/dL — ABNORMAL HIGH (ref 8–27)
Bilirubin Total: 0.3 mg/dL (ref 0.0–1.2)
CO2: 19 mmol/L — ABNORMAL LOW (ref 20–29)
Calcium: 9.6 mg/dL (ref 8.7–10.3)
Chloride: 105 mmol/L (ref 96–106)
Creatinine, Ser: 1.58 mg/dL — ABNORMAL HIGH (ref 0.57–1.00)
Globulin, Total: 1.7 g/dL (ref 1.5–4.5)
Glucose: 92 mg/dL (ref 70–99)
Potassium: 4.7 mmol/L (ref 3.5–5.2)
Sodium: 137 mmol/L (ref 134–144)
Total Protein: 7.1 g/dL (ref 6.0–8.5)
eGFR: 33 mL/min/{1.73_m2} — ABNORMAL LOW (ref >59)

## 2021-10-29 LAB — LIPID PANEL
Chol/HDL Ratio: 3 ratio (ref 0.0–4.4)
Cholesterol, Total: 175 mg/dL (ref 100–199)
HDL: 59 mg/dL (ref >39)
LDL Chol Calc (NIH): 99 mg/dL (ref 0–99)
Triglycerides: 94 mg/dL (ref 0–149)
VLDL Cholesterol Cal: 17 mg/dL (ref 5–40)

## 2021-10-29 LAB — URIC ACID: Uric Acid: 5.9 mg/dL (ref 3.1–7.9)

## 2021-10-29 NOTE — Progress Notes (Signed)
Hello Trista,  Your lab result is normal and/or stable.Some minor variations that are not significant are commonly marked abnormal, but do not represent any medical problem for you.  Best regards, Mechele Claude, M.D.

## 2021-11-03 ENCOUNTER — Ambulatory Visit: Payer: Medicare Other | Admitting: Family Medicine

## 2022-01-07 ENCOUNTER — Other Ambulatory Visit: Payer: Self-pay | Admitting: Nurse Practitioner

## 2022-01-07 DIAGNOSIS — I10 Essential (primary) hypertension: Secondary | ICD-10-CM

## 2022-01-15 ENCOUNTER — Ambulatory Visit (INDEPENDENT_AMBULATORY_CARE_PROVIDER_SITE_OTHER): Payer: Medicare Other

## 2022-01-15 DIAGNOSIS — Z23 Encounter for immunization: Secondary | ICD-10-CM

## 2022-02-14 DIAGNOSIS — R300 Dysuria: Secondary | ICD-10-CM | POA: Diagnosis not present

## 2022-02-14 DIAGNOSIS — N3001 Acute cystitis with hematuria: Secondary | ICD-10-CM | POA: Diagnosis not present

## 2022-04-30 ENCOUNTER — Encounter: Payer: Self-pay | Admitting: Family Medicine

## 2022-04-30 ENCOUNTER — Ambulatory Visit (INDEPENDENT_AMBULATORY_CARE_PROVIDER_SITE_OTHER): Payer: Medicare Other | Admitting: Family Medicine

## 2022-04-30 VITALS — BP 129/73 | HR 81 | Temp 97.6°F | Ht 61.0 in | Wt 99.4 lb

## 2022-04-30 DIAGNOSIS — M1000 Idiopathic gout, unspecified site: Secondary | ICD-10-CM | POA: Diagnosis not present

## 2022-04-30 DIAGNOSIS — E782 Mixed hyperlipidemia: Secondary | ICD-10-CM | POA: Diagnosis not present

## 2022-04-30 DIAGNOSIS — M810 Age-related osteoporosis without current pathological fracture: Secondary | ICD-10-CM | POA: Diagnosis not present

## 2022-04-30 DIAGNOSIS — I10 Essential (primary) hypertension: Secondary | ICD-10-CM

## 2022-04-30 LAB — LIPID PANEL

## 2022-04-30 MED ORDER — ALENDRONATE SODIUM 70 MG PO TABS: 70.0000 mg | ORAL_TABLET | ORAL | 11 refills | Status: DC

## 2022-04-30 MED ORDER — LISINOPRIL 20 MG PO TABS: 20.0000 mg | ORAL_TABLET | Freq: Every day | ORAL | 3 refills | Status: DC

## 2022-04-30 NOTE — Progress Notes (Signed)
Subjective:  Patient ID: Tamara Gross, female    DOB: 06-28-1940  Age: 82 y.o. MRN: [id number]  CC: Medical Management of Chronic Issues   HPI Tamara Gross presents for  follow-up of hypertension. Patient has no history of headache chest pain or shortness of breath or recent cough. Patient also denies symptoms of TIA such as focal numbness or weakness. Patient denies side effects from medication. States taking it regularly.  Denies flare of gout. Continues the allopurinol.      History Tamara Gross has a past medical history of Gout, Hypertension, and Osteoporosis.   She has a past surgical history that includes Tonsillectomy.   Her family history includes Cancer in her mother; Deep vein thrombosis in her father; Hypertension in her brother and sister.She reports that she has never smoked. She has never used smokeless tobacco. She reports that she does not drink alcohol and does not use drugs.  Current Outpatient Medications on File Prior to Visit  Medication Sig Dispense Refill   allopurinol (ZYLOPRIM) 100 MG tablet Take 1 tablet (100 mg total) by mouth daily. 90 tablet 3   Cholecalciferol (VITAMIN D) 2000 UNITS CAPS Take 1 capsule by mouth.     docusate sodium (COLACE) 100 MG capsule Take 1 capsule (100 mg total) by mouth 2 (two) times daily. To soften stool 60 capsule 5   Multiple Vitamin (MULTIVITAMIN) tablet Take 1 tablet by mouth daily.     NIFEdipine (PROCARDIA XL/NIFEDICAL XL) 60 MG 24 hr tablet Take 1 tablet by mouth once daily 90 tablet 1   No current facility-administered medications on file prior to visit.    ROS Review of Systems  Constitutional: Negative.   HENT: Negative.    Eyes:  Negative for visual disturbance.  Respiratory:  Negative for shortness of breath.   Cardiovascular:  Negative for chest pain.  Gastrointestinal:  Negative for abdominal pain.  Musculoskeletal:  Negative for arthralgias.    Objective:  BP 129/73   Pulse 81   Temp 97.6 F (36.4 C)    Ht 5\' 1"  (1.549 m)   Wt 99 lb 6.4 oz (45.1 kg)   SpO2 97%   BMI 18.78 kg/m   BP Readings from Last 3 Encounters:  04/30/22 129/73  10/28/21 124/74  05/06/21 138/76    Wt Readings from Last 3 Encounters:  04/30/22 99 lb 6.4 oz (45.1 kg)  10/28/21 102 lb 3.2 oz (46.4 kg)  07/09/21 100 lb (45.4 kg)     Physical Exam Constitutional:      General: She is not in acute distress.    Appearance: She is well-developed and normal weight.  HENT:     Head: Normocephalic.  Cardiovascular:     Rate and Rhythm: Normal rate and regular rhythm.  Pulmonary:     Breath sounds: Normal breath sounds.  Musculoskeletal:        General: Normal range of motion.  Skin:    General: Skin is warm and dry.  Neurological:     Mental Status: She is alert and oriented to person, place, and time.       Assessment & Plan:   Tamara Gross was seen today for medical management of chronic issues.  Diagnoses and all orders for this visit:  Primary hypertension -     CBC with Differential/Platelet -     CMP14+EGFR  Mixed hyperlipidemia -     Lipid panel  Essential hypertension -     lisinopril (ZESTRIL) 20 MG tablet; Take 1 tablet (  20 mg total) by mouth daily.  Idiopathic gout, unspecified chronicity, unspecified site -     Uric acid  Age-related osteoporosis without current pathological fracture -     alendronate (FOSAMAX) 70 MG tablet; Take 1 tablet (70 mg total) by mouth every 7 (seven) days. Take with a full glass of water on an empty stomach. Do not lay down for at least 2 hours   Allergies as of 04/30/2022   No Known Allergies      Medication List        Accurate as of April 30, 2022  9:03 AM. If you have any questions, ask your nurse or doctor.          alendronate 70 MG tablet Commonly known as: FOSAMAX Take 1 tablet (70 mg total) by mouth every 7 (seven) days. Take with a full glass of water on an empty stomach. Do not lay down for at least 2 hours   allopurinol 100 MG  tablet Commonly known as: ZYLOPRIM Take 1 tablet (100 mg total) by mouth daily.   docusate sodium 100 MG capsule Commonly known as: Colace Take 1 capsule (100 mg total) by mouth 2 (two) times daily. To soften stool   lisinopril 20 MG tablet Commonly known as: ZESTRIL Take 1 tablet (20 mg total) by mouth daily.   multivitamin tablet Take 1 tablet by mouth daily.   NIFEdipine 60 MG 24 hr tablet Commonly known as: PROCARDIA XL/NIFEDICAL XL Take 1 tablet by mouth once daily   Vitamin D 50 MCG (2000 UT) Caps Take 1 capsule by mouth.        Meds ordered this encounter  Medications   alendronate (FOSAMAX) 70 MG tablet    Sig: Take 1 tablet (70 mg total) by mouth every 7 (seven) days. Take with a full glass of water on an empty stomach. Do not lay down for at least 2 hours    Dispense:  4 tablet    Refill:  11   lisinopril (ZESTRIL) 20 MG tablet    Sig: Take 1 tablet (20 mg total) by mouth daily.    Dispense:  90 tablet    Refill:  3    Condition stable, continue present regimen, labs pending  Follow-up: Return in about 6 months (around 10/29/2022) for Compete physical, with DEXA.  Claretta Fraise, M.D.

## 2022-05-01 LAB — CMP14+EGFR
ALT: 11 IU/L (ref 0–32)
AST: 17 IU/L (ref 0–40)
Albumin/Globulin Ratio: 1.6 (ref 1.2–2.2)
Albumin: 4.3 g/dL (ref 3.7–4.7)
Alkaline Phosphatase: 52 IU/L (ref 44–121)
BUN/Creatinine Ratio: 18 (ref 12–28)
BUN: 29 mg/dL — ABNORMAL HIGH (ref 8–27)
Bilirubin Total: 0.3 mg/dL (ref 0.0–1.2)
CO2: 19 mmol/L — ABNORMAL LOW (ref 20–29)
Calcium: 9.6 mg/dL (ref 8.7–10.3)
Chloride: 103 mmol/L (ref 96–106)
Creatinine, Ser: 1.58 mg/dL — ABNORMAL HIGH (ref 0.57–1.00)
Globulin, Total: 2.7 g/dL (ref 1.5–4.5)
Glucose: 99 mg/dL (ref 70–99)
Potassium: 4.8 mmol/L (ref 3.5–5.2)
Sodium: 139 mmol/L (ref 134–144)
Total Protein: 7 g/dL (ref 6.0–8.5)
eGFR: 32 mL/min/{1.73_m2} — ABNORMAL LOW (ref >59)

## 2022-05-01 LAB — CBC WITH DIFFERENTIAL/PLATELET
Basophils Absolute: 0 10*3/uL (ref 0.0–0.2)
Basos: 1 %
EOS (ABSOLUTE): 0.1 10*3/uL (ref 0.0–0.4)
Eos: 3 %
Hematocrit: 34 % (ref 34.0–46.6)
Hemoglobin: 11 g/dL — ABNORMAL LOW (ref 11.1–15.9)
Immature Grans (Abs): 0 10*3/uL (ref 0.0–0.1)
Immature Granulocytes: 0 %
Lymphocytes Absolute: 1.1 10*3/uL (ref 0.7–3.1)
Lymphs: 24 %
MCH: 31.4 pg (ref 26.6–33.0)
MCHC: 32.4 g/dL (ref 31.5–35.7)
MCV: 97 fL (ref 79–97)
Monocytes Absolute: 0.4 10*3/uL (ref 0.1–0.9)
Monocytes: 9 %
Neutrophils Absolute: 2.9 10*3/uL (ref 1.4–7.0)
Neutrophils: 63 %
Platelets: 331 10*3/uL (ref 150–450)
RBC: 3.5 x10E6/uL — ABNORMAL LOW (ref 3.77–5.28)
RDW: 12.8 % (ref 11.7–15.4)
WBC: 4.6 10*3/uL (ref 3.4–10.8)

## 2022-05-01 LAB — LIPID PANEL
Chol/HDL Ratio: 3.3 ratio (ref 0.0–4.4)
Cholesterol, Total: 184 mg/dL (ref 100–199)
HDL: 56 mg/dL (ref >39)
LDL Chol Calc (NIH): 110 mg/dL — ABNORMAL HIGH (ref 0–99)
Triglycerides: 99 mg/dL (ref 0–149)
VLDL Cholesterol Cal: 18 mg/dL (ref 5–40)

## 2022-05-01 LAB — URIC ACID: Uric Acid: 6.2 mg/dL (ref 3.1–7.9)

## 2022-07-08 ENCOUNTER — Other Ambulatory Visit: Payer: Self-pay | Admitting: Family Medicine

## 2022-07-08 DIAGNOSIS — I10 Essential (primary) hypertension: Secondary | ICD-10-CM

## 2022-07-13 ENCOUNTER — Ambulatory Visit (INDEPENDENT_AMBULATORY_CARE_PROVIDER_SITE_OTHER): Payer: Medicare Other

## 2022-07-13 VITALS — Ht 61.0 in | Wt 102.0 lb

## 2022-07-13 DIAGNOSIS — Z780 Asymptomatic menopausal state: Secondary | ICD-10-CM

## 2022-07-13 DIAGNOSIS — Z0000 Encounter for general adult medical examination without abnormal findings: Secondary | ICD-10-CM | POA: Diagnosis not present

## 2022-07-13 NOTE — Patient Instructions (Signed)
[name] , Thank you for taking time to come for your Medicare Wellness Visit. I appreciate your ongoing commitment to your health goals. Please review the following plan we discussed and let me know if I can assist you in the future.   These are the goals we discussed:  Goals       Patient Stated      I would like to stay healthy so I can continue helping care for my brother and sister, and I hope to eventually be able to travel more.      Prevent falls (pt-stated)        This is a list of the screening recommended for you and due dates:  Health Maintenance  Topic Date Due   COVID-19 Vaccine (4 - 2023-24 season) 12/19/2021   DEXA scan (bone density measurement)  05/02/2022   Medicare Annual Wellness Visit  07/13/2023   DTaP/Tdap/Td vaccine (2 - Td or Tdap) 05/07/2024   Pneumonia Vaccine  Completed   Flu Shot  Completed   Zoster (Shingles) Vaccine  Completed   HPV Vaccine  Aged Out    Advanced directives: Please bring a copy of your health care power of attorney and living will to the office to be added to your chart at your convenience.   Conditions/risks identified: Aim for 30 minutes of exercise or brisk walking, 6-8 glasses of water, and 5 servings of fruits and vegetables each day.   Next appointment: Follow up in one year for your annual wellness visit    Preventive Care 65 Years and Older, Female Preventive care refers to lifestyle choices and visits with your health care provider that can promote health and wellness. What does preventive care include? A yearly physical exam. This is also called an annual well check. Dental exams once or twice a year. Routine eye exams. Ask your health care provider how often you should have your eyes checked. Personal lifestyle choices, including: Daily care of your teeth and gums. Regular physical activity. Eating a healthy diet. Avoiding tobacco and drug use. Limiting alcohol use. Practicing safe sex. Taking low-dose aspirin  every day. Taking vitamin and mineral supplements as recommended by your health care provider. What happens during an annual well check? The services and screenings done by your health care provider during your annual well check will depend on your age, overall health, lifestyle risk factors, and family history of disease. Counseling  Your health care provider may ask you questions about your: Alcohol use. Tobacco use. Drug use. Emotional well-being. Home and relationship well-being. Sexual activity. Eating habits. History of falls. Memory and ability to understand (cognition). Work and work Statistician. Reproductive health. Screening  You may have the following tests or measurements: Height, weight, and BMI. Blood pressure. Lipid and cholesterol levels. These may be checked every 5 years, or more frequently if you are over 57 years old. Skin check. Lung cancer screening. You may have this screening every year starting at age 27 if you have a 30-pack-year history of smoking and currently smoke or have quit within the past 15 years. Fecal occult blood test (FOBT) of the stool. You may have this test every year starting at age 41. Flexible sigmoidoscopy or colonoscopy. You may have a sigmoidoscopy every 5 years or a colonoscopy every 10 years starting at age 50. Hepatitis C blood test. Hepatitis B blood test. Sexually transmitted disease (STD) testing. Diabetes screening. This is done by checking your blood sugar (glucose) after you have not eaten for  a while (fasting). You may have this done every 1-3 years. Bone density scan. This is done to screen for osteoporosis. You may have this done starting at age 38. Mammogram. This may be done every 1-2 years. Talk to your health care provider about how often you should have regular mammograms. Talk with your health care provider about your test results, treatment options, and if necessary, the need for more tests. Vaccines  Your health  care provider may recommend certain vaccines, such as: Influenza vaccine. This is recommended every year. Tetanus, diphtheria, and acellular pertussis (Tdap, Td) vaccine. You may need a Td booster every 10 years. Zoster vaccine. You may need this after age 75. Pneumococcal 13-valent conjugate (PCV13) vaccine. One dose is recommended after age 60. Pneumococcal polysaccharide (PPSV23) vaccine. One dose is recommended after age 53. Talk to your health care provider about which screenings and vaccines you need and how often you need them. This information is not intended to replace advice given to you by your health care provider. Make sure you discuss any questions you have with your health care provider. Document Released: 05/03/2015 Document Revised: 12/25/2015 Document Reviewed: 02/05/2015 Elsevier Interactive Patient Education  2017 [employer] Prevention in the Home Falls can cause injuries. They can happen to people of all ages. There are many things you can do to make your home safe and to help prevent falls. What can I do on the outside of my home? Regularly fix the edges of walkways and driveways and fix any cracks. Remove anything that might make you trip as you walk through a door, such as a raised step or threshold. Trim any bushes or trees on the path to your home. Use bright outdoor lighting. Clear any walking paths of anything that might make someone trip, such as rocks or tools. Regularly check to see if handrails are loose or broken. Make sure that both sides of any steps have handrails. Any raised decks and porches should have guardrails on the edges. Have any leaves, snow, or ice cleared regularly. Use sand or salt on walking paths during winter. Clean up any spills in your garage right away. This includes oil or grease spills. What can I do in the bathroom? Use night lights. Install grab bars by the toilet and in the tub and shower. Do not use towel bars as grab  bars. Use non-skid mats or decals in the tub or shower. If you need to sit down in the shower, use a plastic, non-slip stool. Keep the floor dry. Clean up any water that spills on the floor as soon as it happens. Remove soap buildup in the tub or shower regularly. Attach bath mats securely with double-sided non-slip rug tape. Do not have throw rugs and other things on the floor that can make you trip. What can I do in the bedroom? Use night lights. Make sure that you have a light by your bed that is easy to reach. Do not use any sheets or blankets that are too big for your bed. They should not hang down onto the floor. Have a firm chair that has side arms. You can use this for support while you get dressed. Do not have throw rugs and other things on the floor that can make you trip. What can I do in the kitchen? Clean up any spills right away. Avoid walking on wet floors. Keep items that you use a lot in easy-to-reach places. If you need to reach something above  you, use a strong step stool that has a grab bar. Keep electrical cords out of the way. Do not use floor polish or wax that makes floors slippery. If you must use wax, use non-skid floor wax. Do not have throw rugs and other things on the floor that can make you trip. What can I do with my stairs? Do not leave any items on the stairs. Make sure that there are handrails on both sides of the stairs and use them. Fix handrails that are broken or loose. Make sure that handrails are as long as the stairways. Check any carpeting to make sure that it is firmly attached to the stairs. Fix any carpet that is loose or worn. Avoid having throw rugs at the top or bottom of the stairs. If you do have throw rugs, attach them to the floor with carpet tape. Make sure that you have a light switch at the top of the stairs and the bottom of the stairs. If you do not have them, ask someone to add them for you. What else can I do to help prevent  falls? Wear shoes that: Do not have high heels. Have rubber bottoms. Are comfortable and fit you well. Are closed at the toe. Do not wear sandals. If you use a stepladder: Make sure that it is fully opened. Do not climb a closed stepladder. Make sure that both sides of the stepladder are locked into place. Ask someone to hold it for you, if possible. Clearly mark and make sure that you can see: Any grab bars or handrails. First and last steps. Where the edge of each step is. Use tools that help you move around (mobility aids) if they are needed. These include: Canes. Walkers. Scooters. Crutches. Turn on the lights when you go into a dark area. Replace any light bulbs as soon as they burn out. Set up your furniture so you have a clear path. Avoid moving your furniture around. If any of your floors are uneven, fix them. If there are any pets around you, be aware of where they are. Review your medicines with your doctor. Some medicines can make you feel dizzy. This can increase your chance of falling. Ask your doctor what other things that you can do to help prevent falls. This information is not intended to replace advice given to you by your health care provider. Make sure you discuss any questions you have with your health care provider. Document Released: 01/31/2009 Document Revised: 09/12/2015 Document Reviewed: 05/11/2014 Elsevier Interactive Patient Education  2017 [employer].

## 2022-07-13 NOTE — Progress Notes (Signed)
Subjective:   Tamara Gross is a 82 y.o. female who presents for Medicare Annual (Subsequent) preventive examination. I connected with  Tamara Gross on 07/13/22 by a audio enabled telemedicine application and verified that I am speaking with the correct person using two identifiers.  Patient Location: Home  Provider Location: Home Office  I discussed the limitations of evaluation and management by telemedicine. The patient expressed understanding and agreed to proceed.  Review of Systems     Cardiac Risk Factors include: advanced age (>76men, >6 women);dyslipidemia     Objective:    Today's Vitals   07/13/22 0909  Weight: 102 lb (46.3 kg)  Height: 5\' 1"  (1.549 m)   Body mass index is 19.27 kg/m.     07/13/2022    9:11 AM 07/09/2021    9:39 AM 11/11/2020    9:34 AM 07/08/2020    2:01 PM 06/23/2019   11:10 AM  Advanced Directives  Does Patient Have a Medical Advance Directive? No No No No No  Would patient like information on creating a medical advance directive? No - Patient declined No - Patient declined  No - Patient declined No - Patient declined    Current Medications (verified) Outpatient Encounter Medications as of 07/13/2022  Medication Sig   alendronate (FOSAMAX) 70 MG tablet Take 1 tablet (70 mg total) by mouth every 7 (seven) days. Take with a full glass of water on an empty stomach. Do not lay down for at least 2 hours   allopurinol (ZYLOPRIM) 100 MG tablet Take 1 tablet (100 mg total) by mouth daily.   Cholecalciferol (VITAMIN D) 2000 UNITS CAPS Take 1 capsule by mouth.   docusate sodium (COLACE) 100 MG capsule Take 1 capsule (100 mg total) by mouth 2 (two) times daily. To soften stool   lisinopril (ZESTRIL) 20 MG tablet Take 1 tablet (20 mg total) by mouth daily.   Multiple Vitamin (MULTIVITAMIN) tablet Take 1 tablet by mouth daily.   NIFEdipine (PROCARDIA XL/NIFEDICAL XL) 60 MG 24 hr tablet Take 1 tablet by mouth once daily   No facility-administered  encounter medications on file as of 07/13/2022.    Allergies (verified) Patient has no known allergies.   History: Past Medical History:  Diagnosis Date   Gout    Hypertension    Osteoporosis    Past Surgical History:  Procedure Laterality Date   TONSILLECTOMY     Family History  Problem Relation Age of Onset   Cancer Mother    Deep vein thrombosis Father    Hypertension Sister    Hypertension Brother    Social History   Socioeconomic History   Marital status: Widowed    Spouse name: Not on file   Number of children: 0   Years of education: college   Highest education level: Associate degree: academic program  Occupational History   Occupation: Retired  Tobacco Use   Smoking status: Never   Smokeless tobacco: Never  Scientific laboratory technician Use: Never used  Substance and Sexual Activity   Alcohol use: Never   Drug use: Never   Sexual activity: Not Currently  Other Topics Concern   Not on file  Social History Narrative   Sister recently had stroke - she is helping care for her    Social Determinants of Health   Financial Resource Strain: Low Risk  (07/13/2022)   Overall Financial Resource Strain (CARDIA)    Difficulty of Paying Living Expenses: Not hard at all  Food  Insecurity: No Food Insecurity (07/13/2022)   Hunger Vital Sign    Worried About Running Out of Food in the Last Year: Never true    Ran Out of Food in the Last Year: Never true  Transportation Needs: No Transportation Needs (07/13/2022)   PRAPARE - Hydrologist (Medical): No    Lack of Transportation (Non-Medical): No  Physical Activity: Insufficiently Active (07/13/2022)   Exercise Vital Sign    Days of Exercise per Week: 3 days    Minutes of Exercise per Session: 30 min  Stress: No Stress Concern Present (07/13/2022)   [place]    Feeling of Stress : Not at all  Social Connections: Moderately Isolated  (07/13/2022)   Social Connection and Isolation Panel [NHANES]    Frequency of Communication with Friends and Family: More than three times a week    Frequency of Social Gatherings with Friends and Family: More than three times a week    Attends Religious Services: More than 4 times per year    Active Member of [employer] or Organizations: No    Attends Archivist Meetings: Never    Marital Status: Widowed    Tobacco Counseling Counseling given: Not Answered   Clinical Intake:  Pre-visit preparation completed: Yes  Pain : No/denies pain     Nutritional Risks: None Diabetes: No  How often do you need to have someone help you when you read instructions, pamphlets, or other written materials from your doctor or pharmacy?: 1 - Never  Diabetic?no   Interpreter Needed?: No  Information entered by :: Tamara Pierini, LPN   Activities of Daily Living    07/13/2022    9:11 AM  In your present state of health, do you have any difficulty performing the following activities:  Hearing? 0  Vision? 0  Difficulty concentrating or making decisions? 0  Walking or climbing stairs? 0  Dressing or bathing? 0  Doing errands, shopping? 0  Preparing Food and eating ? N  Using the Toilet? N  In the past six months, have you accidently leaked urine? N  Do you have problems with loss of bowel control? N  Managing your Medications? N  Managing your Finances? N  Housekeeping or managing your Housekeeping? N    Patient Care Team: Claretta Fraise, MD as PCP - General (Family Medicine)  Indicate any recent Medical Services you may have received from other than [facility] providers in the past year (date may be approximate).     Assessment:   This is a routine wellness examination for Tamara Gross.  Hearing/Vision screen Vision Screening - Comments:: Wears rx glasses - up to date with routine eye exams with  Dr.Johnson   Dietary issues and exercise activities discussed: Current Exercise Habits:  Home exercise routine, Type of exercise: walking, Time (Minutes): 30, Frequency (Times/Week): 3, Weekly Exercise (Minutes/Week): 90, Intensity: Mild, Exercise limited by: None identified   Goals Addressed             This Visit's Progress    Patient Stated   On track    I would like to stay healthy so I can continue helping care for my brother and sister, and I hope to eventually be able to travel more.       Depression Screen    07/13/2022    9:10 AM 04/30/2022    8:22 AM 10/28/2021    8:54 AM 07/09/2021    9:05 AM  05/06/2021    8:56 AM 02/18/2021   11:07 AM 01/23/2021   10:33 AM  PHQ 2/9 Scores  PHQ - 2 Score 0 0 0 0 0 0 0  PHQ- 9 Score      0 0    Fall Risk    07/13/2022    9:09 AM 04/30/2022    8:22 AM 10/28/2021    8:54 AM 07/09/2021    9:02 AM 05/06/2021    8:56 AM  Fall Risk   Falls in the past year? 0 0 0 0 0  Number falls in past yr: 0   0   Injury with Fall? 0   0   Risk for fall due to : No Fall Risks   No Fall Risks   Follow up Falls prevention discussed   Falls prevention discussed     FALL RISK PREVENTION PERTAINING TO THE HOME:  Any stairs in or around the home? Yes  If so, are there any without handrails? No  Home free of loose throw rugs in walkways, pet beds, electrical cords, etc? Yes  Adequate lighting in your home to reduce risk of falls? Yes   ASSISTIVE DEVICES UTILIZED TO PREVENT FALLS:  Life alert? No  Use of a cane, walker or w/c? No  Grab bars in the bathroom? No  Shower chair or bench in shower? No  Elevated toilet seat or a handicapped toilet? No       06/22/2018   10:19 AM  MMSE - Mini Mental State Exam  Orientation to time 5  Orientation to Place 5  Registration 3  Attention/ Calculation 5  Recall 3  Language- name 2 objects 2  Language- repeat 1  Language- follow 3 step command 3  Language- read & follow direction 1  Write a sentence 1  Copy design 1  Total score 30        07/13/2022    9:12 AM 07/08/2020    2:06 PM  06/23/2019   10:42 AM  6CIT Screen  What Year? 0 points 0 points 0 points  What month? 0 points 0 points 0 points  What time? 0 points 0 points 0 points  Count back from 20 0 points 2 points 0 points  Months in reverse 0 points 0 points 0 points  Repeat phrase 0 points 0 points 0 points  Total Score 0 points 2 points 0 points    Immunizations Immunization History  Administered Date(s) Administered   Fluad Quad(high Dose 65+) 02/09/2019, 01/16/2020, 01/23/2021, 01/15/2022   Influenza, High Dose Seasonal PF 02/09/2017, 01/28/2018   Influenza,inj,Quad PF,6+ Mos 01/31/2013, 01/29/2014, 01/30/2015, 01/20/2016   PFIZER Comirnaty(Gray Top)Covid-19 Tri-Sucrose Vaccine 07/27/2019, 08/18/2019, 02/20/2020   Pneumococcal Conjugate-13 11/05/2014   Pneumococcal Polysaccharide-23 07/29/2011   Tdap 05/07/2014   Zoster Recombinat (Shingrix) 10/30/2020, 05/06/2021   Zoster, Live 07/04/2013    TDAP status: Up to date  Flu Vaccine status: Up to date  Pneumococcal vaccine status: Up to date  Covid-19 vaccine status: Completed vaccines  Qualifies for Shingles Vaccine? Yes   Zostavax completed Yes   Shingrix Completed?: Yes  Screening Tests Health Maintenance  Topic Date Due   COVID-19 Vaccine (4 - 2023-24 season) 12/19/2021   DEXA SCAN  05/02/2022   Medicare Annual Wellness (AWV)  07/13/2023   DTaP/Tdap/Td (2 - Td or Tdap) 05/07/2024   Pneumonia Vaccine 62+ Years old  Completed   INFLUENZA VACCINE  Completed   Zoster Vaccines- Shingrix  Completed   HPV VACCINES  Aged  Out    Health Maintenance  Health Maintenance Due  Topic Date Due   COVID-19 Vaccine (4 - 2023-24 season) 12/19/2021   DEXA SCAN  05/02/2022    Colorectal cancer screening: No longer required.   Mammogram status: No longer required due to age .  Bone Density status: Ordered 07/13/2022. Pt provided with contact info and advised to call to schedule appt.  Lung Cancer Screening: (Low Dose CT Chest recommended if Age  77-80 years, 30 pack-year currently smoking OR have quit w/in 15years.) does not qualify.   Lung Cancer Screening Referral: n/a  Additional Screening:  Hepatitis C Screening: does not qualify;   Vision Screening: Recommended annual ophthalmology exams for early detection of glaucoma and other disorders of the eye. Is the patient up to date with their annual eye exam?  Yes  Who is the provider or what is the name of the office in which the patient attends annual eye exams? Dr.Johnson  If pt is not established with a provider, would they like to be referred to a provider to establish care? No .   Dental Screening: Recommended annual dental exams for proper oral hygiene  Community Resource Referral / Chronic Care Management: CRR required this visit?  No   CCM required this visit?  No      Plan:     I have personally reviewed and noted the following in the patient's chart:   Medical and social history Use of alcohol, tobacco or illicit drugs  Current medications and supplements including opioid prescriptions. Patient is not currently taking opioid prescriptions. Functional ability and status Nutritional status Physical activity Advanced directives List of other physicians Hospitalizations, surgeries, and ER visits in previous 12 months Vitals Screenings to include cognitive, depression, and falls Referrals and appointments  In addition, I have reviewed and discussed with patient certain preventive protocols, quality metrics, and best practice recommendations. A written personalized care plan for preventive services as well as general preventive health recommendations were provided to patient.     Daphane Shepherd, LPN   [id number]   Nurse Notes: none

## 2022-10-12 ENCOUNTER — Other Ambulatory Visit: Payer: Self-pay | Admitting: *Deleted

## 2022-10-12 DIAGNOSIS — I10 Essential (primary) hypertension: Secondary | ICD-10-CM

## 2022-10-12 MED ORDER — NIFEDIPINE ER OSMOTIC RELEASE 60 MG PO TB24: 60.0000 mg | ORAL_TABLET | Freq: Every day | ORAL | 0 refills | Status: DC

## 2022-10-29 ENCOUNTER — Ambulatory Visit (INDEPENDENT_AMBULATORY_CARE_PROVIDER_SITE_OTHER): Payer: Medicare Other | Admitting: Family Medicine

## 2022-10-29 ENCOUNTER — Other Ambulatory Visit: Payer: Self-pay | Admitting: Family Medicine

## 2022-10-29 ENCOUNTER — Ambulatory Visit (INDEPENDENT_AMBULATORY_CARE_PROVIDER_SITE_OTHER): Payer: Medicare Other

## 2022-10-29 ENCOUNTER — Encounter: Payer: Self-pay | Admitting: Family Medicine

## 2022-10-29 VITALS — BP 109/68 | HR 85 | Temp 97.0°F | Ht 61.0 in | Wt 96.4 lb

## 2022-10-29 DIAGNOSIS — I10 Essential (primary) hypertension: Secondary | ICD-10-CM

## 2022-10-29 DIAGNOSIS — M1000 Idiopathic gout, unspecified site: Secondary | ICD-10-CM

## 2022-10-29 DIAGNOSIS — Z780 Asymptomatic menopausal state: Secondary | ICD-10-CM | POA: Diagnosis not present

## 2022-10-29 DIAGNOSIS — N3090 Cystitis, unspecified without hematuria: Secondary | ICD-10-CM | POA: Diagnosis not present

## 2022-10-29 DIAGNOSIS — Z0000 Encounter for general adult medical examination without abnormal findings: Secondary | ICD-10-CM

## 2022-10-29 DIAGNOSIS — R300 Dysuria: Secondary | ICD-10-CM | POA: Diagnosis not present

## 2022-10-29 DIAGNOSIS — E782 Mixed hyperlipidemia: Secondary | ICD-10-CM

## 2022-10-29 DIAGNOSIS — M810 Age-related osteoporosis without current pathological fracture: Secondary | ICD-10-CM

## 2022-10-29 LAB — MICROSCOPIC EXAMINATION
Renal Epithel, UA: NONE SEEN /hpf
WBC, UA: >30 /hpf — AB (ref 0–5)

## 2022-10-29 LAB — URINALYSIS, COMPLETE
Bilirubin, UA: NEGATIVE
Glucose, UA: NEGATIVE
Ketones, UA: NEGATIVE
Nitrite, UA: POSITIVE — AB
Protein,UA: NEGATIVE
Specific Gravity, UA: 1.01 (ref 1.005–1.030)
Urobilinogen, Ur: 0.2 mg/dL (ref 0.2–1.0)
pH, UA: 5.5 (ref 5.0–7.5)

## 2022-10-29 MED ORDER — ALENDRONATE SODIUM 70 MG PO TABS
70.0000 mg | ORAL_TABLET | ORAL | 3 refills | Status: DC
Start: 2022-10-29 — End: 2023-12-01

## 2022-10-29 MED ORDER — ALLOPURINOL 100 MG PO TABS
100.0000 mg | ORAL_TABLET | Freq: Every day | ORAL | 3 refills | Status: AC
Start: 2022-10-29 — End: ?

## 2022-10-29 MED ORDER — LISINOPRIL 20 MG PO TABS
20.0000 mg | ORAL_TABLET | Freq: Every day | ORAL | 3 refills | Status: DC
Start: 2022-10-29 — End: 2023-11-04

## 2022-10-29 MED ORDER — SULFAMETHOXAZOLE-TRIMETHOPRIM 800-160 MG PO TABS: 1.0000 | ORAL_TABLET | Freq: Two times a day (BID) | ORAL | 0 refills | Status: DC

## 2022-10-29 MED ORDER — NIFEDIPINE ER OSMOTIC RELEASE 60 MG PO TB24
60.0000 mg | ORAL_TABLET | Freq: Every day | ORAL | 3 refills | Status: DC
Start: 2022-10-29 — End: 2023-12-27

## 2022-10-29 NOTE — Addendum Note (Signed)
Addended by: Mechele Claude on: 10/29/2022 10:32 AM   Modules accepted: Orders

## 2022-10-29 NOTE — Progress Notes (Signed)
DEXA shows osteoporosis. Continue Alendronate every week and Take Vitamin D 2000 units daily along with three servings of dairy or calcium supplement three daily.  Nurse, if pt. Is agreeable, send in Fosamax 70 mg weekly, #13.  Thanks, WS

## 2022-10-29 NOTE — Progress Notes (Addendum)
Subjective:  Patient ID: Tamara Gross, female    DOB: 02/04/1941  Age: 82 y.o. MRN: [id number]  CC: Annual Exam   HPI Tamara Gross presents for Annual PHysical     10/29/2022    7:54 AM 07/13/2022    9:10 AM 04/30/2022    8:22 AM  Depression screen PHQ 2/9  Decreased Interest 0 0 0  Down, Depressed, Hopeless 0 0 0  PHQ - 2 Score 0 0 0    History Tamara Gross has a past medical history of Gout, Hypertension, and Osteoporosis.   She has a past surgical history that includes Tonsillectomy.   Her family history includes Cancer in her mother; Deep vein thrombosis in her father; Hypertension in her brother and sister.She reports that she has never smoked. She has never used smokeless tobacco. She reports that she does not drink alcohol and does not use drugs.    ROS Review of Systems  Constitutional:  Negative for appetite change, chills, diaphoresis, fatigue, fever and unexpected weight change.  HENT:  Negative for congestion, ear pain, hearing loss, postnasal drip, rhinorrhea, sneezing, sore throat and trouble swallowing.   Eyes:  Negative for pain.  Respiratory:  Negative for cough, chest tightness and shortness of breath.   Cardiovascular:  Negative for chest pain and palpitations.  Gastrointestinal:  Negative for abdominal pain, constipation, diarrhea, nausea and vomiting.  Endocrine: Negative for cold intolerance, heat intolerance, polydipsia, polyphagia and polyuria.  Genitourinary:  Negative for dysuria, frequency and menstrual problem.  Musculoskeletal:  Positive for arthralgias (occasional pain right thigh when driving a lot). Negative for joint swelling.  Skin:  Negative for rash.  Allergic/Immunologic: Negative for environmental allergies.  Neurological:  Negative for dizziness, weakness, numbness and headaches.  Psychiatric/Behavioral:  Negative for agitation and dysphoric mood.     Objective:  BP 109/68   Pulse 85   Temp (!) 97 F (36.1 C)   Ht 5\' 1"  (1.549 m)    Wt 96 lb 6.4 oz (43.7 kg)   SpO2 95%   BMI 18.21 kg/m   BP Readings from Last 3 Encounters:  10/29/22 109/68  04/30/22 129/73  10/28/21 124/74    Wt Readings from Last 3 Encounters:  10/29/22 96 lb 6.4 oz (43.7 kg)  07/13/22 102 lb (46.3 kg)  04/30/22 99 lb 6.4 oz (45.1 kg)     Physical Exam Constitutional:      General: She is not in acute distress.    Appearance: Normal appearance. She is well-developed.  HENT:     Head: Normocephalic and atraumatic.     Right Ear: External ear normal.     Left Ear: External ear normal.     Nose: Nose normal.  Eyes:     Conjunctiva/sclera: Conjunctivae normal.     Pupils: Pupils are equal, round, and reactive to light.  Neck:     Thyroid: No thyromegaly.  Cardiovascular:     Rate and Rhythm: Normal rate and regular rhythm.     Heart sounds: Normal heart sounds. No murmur heard. Pulmonary:     Effort: Pulmonary effort is normal. No respiratory distress.     Breath sounds: Normal breath sounds. No wheezing or rales.  Abdominal:     General: Bowel sounds are normal. There is no distension.     Palpations: Abdomen is soft.     Tenderness: There is no abdominal tenderness.  Musculoskeletal:     Cervical back: Normal range of motion and neck supple.  Lymphadenopathy:  Cervical: No cervical adenopathy.  Skin:    General: Skin is warm and dry.  Neurological:     Mental Status: She is alert and oriented to person, place, and time.     Deep Tendon Reflexes: Reflexes are normal and symmetric.  Psychiatric:        Behavior: Behavior normal.        Thought Content: Thought content normal.        Judgment: Judgment normal.       Assessment & Plan:   Tamara Gross was seen today for annual exam.  Diagnoses and all orders for this visit:  Primary hypertension -     CBC with Differential/Platelet -     CMP14+EGFR -     NIFEdipine (PROCARDIA XL/NIFEDICAL XL) 60 MG 24 hr tablet; Take 1 tablet (60 mg total) by mouth daily.  Mixed  hyperlipidemia -     Lipid panel  Well adult exam -     CBC with Differential/Platelet -     CMP14+EGFR -     Lipid panel  Dysuria -     Urinalysis, Complete -     Urine Culture  Age-related osteoporosis without current pathological fracture -     Cancel: DG Bone Density; Future -     alendronate (FOSAMAX) 70 MG tablet; Take 1 tablet (70 mg total) by mouth every 7 (seven) days. Take with a full glass of water on an empty stomach. Do not lay down for at least 2 hours  Idiopathic gout, unspecified chronicity, unspecified site -     Uric acid -     allopurinol (ZYLOPRIM) 100 MG tablet; Take 1 tablet (100 mg total) by mouth daily.  Essential hypertension -     lisinopril (ZESTRIL) 20 MG tablet; Take 1 tablet (20 mg total) by mouth daily.  Cystitis  Other orders -     sulfamethoxazole-trimethoprim (BACTRIM DS) 800-160 MG tablet; Take 1 tablet by mouth 2 (two) times daily.       I am having Tamara Gross start on sulfamethoxazole-trimethoprim. I am also having her maintain her Vitamin D, multivitamin, docusate sodium, alendronate, allopurinol, lisinopril, and NIFEdipine.  Allergies as of 10/29/2022   Not on File      Medication List        Accurate as of October 29, 2022 10:32 AM. If you have any questions, ask your nurse or doctor.          alendronate 70 MG tablet Commonly known as: FOSAMAX Take 1 tablet (70 mg total) by mouth every 7 (seven) days. Take with a full glass of water on an empty stomach. Do not lay down for at least 2 hours   allopurinol 100 MG tablet Commonly known as: ZYLOPRIM Take 1 tablet (100 mg total) by mouth daily.   docusate sodium 100 MG capsule Commonly known as: Colace Take 1 capsule (100 mg total) by mouth 2 (two) times daily. To soften stool   lisinopril 20 MG tablet Commonly known as: ZESTRIL Take 1 tablet (20 mg total) by mouth daily.   multivitamin tablet Take 1 tablet by mouth daily.   NIFEdipine 60 MG 24 hr tablet Commonly  known as: PROCARDIA XL/NIFEDICAL XL Take 1 tablet (60 mg total) by mouth daily.   sulfamethoxazole-trimethoprim 800-160 MG tablet Commonly known as: BACTRIM DS Take 1 tablet by mouth 2 (two) times daily. Started by: Broadus John Josselin Gaulin   Vitamin D 50 MCG (2000 UT) Caps Take 1 capsule by mouth.  Follow-up: Return in about 6 months (around 05/01/2023) for hypertension.  Mechele Claude, M.D.

## 2022-10-30 LAB — CBC WITH DIFFERENTIAL/PLATELET
Basophils Absolute: 0.1 10*3/uL (ref 0.0–0.2)
Basos: 1 %
EOS (ABSOLUTE): 0.2 10*3/uL (ref 0.0–0.4)
Eos: 4 %
Hematocrit: 33.3 % — ABNORMAL LOW (ref 34.0–46.6)
Hemoglobin: 10.7 g/dL — ABNORMAL LOW (ref 11.1–15.9)
Immature Grans (Abs): 0 10*3/uL (ref 0.0–0.1)
Immature Granulocytes: 0 %
Lymphocytes Absolute: 1.2 10*3/uL (ref 0.7–3.1)
Lymphs: 20 %
MCH: 31 pg (ref 26.6–33.0)
MCHC: 32.1 g/dL (ref 31.5–35.7)
MCV: 97 fL (ref 79–97)
Monocytes Absolute: 0.6 10*3/uL (ref 0.1–0.9)
Monocytes: 10 %
Neutrophils Absolute: 3.9 10*3/uL (ref 1.4–7.0)
Neutrophils: 65 %
Platelets: 315 10*3/uL (ref 150–450)
RBC: 3.45 x10E6/uL — ABNORMAL LOW (ref 3.77–5.28)
RDW: 12.5 % (ref 11.7–15.4)
WBC: 6 10*3/uL (ref 3.4–10.8)

## 2022-10-30 LAB — CMP14+EGFR
ALT: 5 IU/L (ref 0–32)
AST: 13 IU/L (ref 0–40)
Albumin: 4.3 g/dL (ref 3.7–4.7)
Alkaline Phosphatase: 57 IU/L (ref 44–121)
BUN/Creatinine Ratio: 18 (ref 12–28)
BUN: 30 mg/dL — ABNORMAL HIGH (ref 8–27)
Bilirubin Total: 0.3 mg/dL (ref 0.0–1.2)
CO2: 19 mmol/L — ABNORMAL LOW (ref 20–29)
Calcium: 9.5 mg/dL (ref 8.7–10.3)
Chloride: 104 mmol/L (ref 96–106)
Creatinine, Ser: 1.67 mg/dL — ABNORMAL HIGH (ref 0.57–1.00)
Globulin, Total: 2.6 g/dL (ref 1.5–4.5)
Glucose: 96 mg/dL (ref 70–99)
Potassium: 4.6 mmol/L (ref 3.5–5.2)
Sodium: 138 mmol/L (ref 134–144)
Total Protein: 6.9 g/dL (ref 6.0–8.5)
eGFR: 30 mL/min/{1.73_m2} — ABNORMAL LOW (ref >59)

## 2022-10-30 LAB — URIC ACID: Uric Acid: 5.4 mg/dL (ref 3.1–7.9)

## 2022-10-30 LAB — LIPID PANEL
Chol/HDL Ratio: 3.1 ratio (ref 0.0–4.4)
Cholesterol, Total: 181 mg/dL (ref 100–199)
HDL: 59 mg/dL (ref >39)
LDL Chol Calc (NIH): 103 mg/dL — ABNORMAL HIGH (ref 0–99)
Triglycerides: 105 mg/dL (ref 0–149)
VLDL Cholesterol Cal: 19 mg/dL (ref 5–40)

## 2022-11-01 LAB — URINE CULTURE

## 2022-11-08 NOTE — Addendum Note (Signed)
Addended by: Mechele Claude on: 11/08/2022 08:57 PM   Modules accepted: Level of Service

## 2023-01-09 ENCOUNTER — Encounter (HOSPITAL_COMMUNITY): Payer: Self-pay | Admitting: Emergency Medicine

## 2023-01-09 ENCOUNTER — Other Ambulatory Visit: Payer: Self-pay

## 2023-01-09 ENCOUNTER — Emergency Department (HOSPITAL_COMMUNITY)
Admission: EM | Admit: 2023-01-09 | Discharge: 2023-01-09 | Disposition: A | Discharge status: Home / Self Care | Payer: Medicare Other | Attending: Emergency Medicine | Admitting: Emergency Medicine

## 2023-01-09 DIAGNOSIS — N189 Chronic kidney disease, unspecified: Secondary | ICD-10-CM | POA: Diagnosis not present

## 2023-01-09 DIAGNOSIS — I129 Hypertensive chronic kidney disease with stage 1 through stage 4 chronic kidney disease, or unspecified chronic kidney disease: Secondary | ICD-10-CM | POA: Diagnosis not present

## 2023-01-09 DIAGNOSIS — N3001 Acute cystitis with hematuria: Secondary | ICD-10-CM | POA: Insufficient documentation

## 2023-01-09 DIAGNOSIS — Z79899 Other long term (current) drug therapy: Secondary | ICD-10-CM | POA: Diagnosis not present

## 2023-01-09 DIAGNOSIS — K625 Hemorrhage of anus and rectum: Secondary | ICD-10-CM | POA: Insufficient documentation

## 2023-01-09 DIAGNOSIS — R197 Diarrhea, unspecified: Secondary | ICD-10-CM | POA: Diagnosis not present

## 2023-01-09 DIAGNOSIS — K921 Melena: Secondary | ICD-10-CM | POA: Diagnosis not present

## 2023-01-09 DIAGNOSIS — Z8719 Personal history of other diseases of the digestive system: Secondary | ICD-10-CM | POA: Diagnosis not present

## 2023-01-09 LAB — CBC
HCT: 35.9 % — ABNORMAL LOW (ref 36.0–46.0)
Hemoglobin: 11.4 g/dL — ABNORMAL LOW (ref 12.0–15.0)
MCH: 31.6 pg (ref 26.0–34.0)
MCHC: 31.8 g/dL (ref 30.0–36.0)
MCV: 99.4 fL (ref 80.0–100.0)
Platelets: 324 10*3/uL (ref 150–400)
RBC: 3.61 MIL/uL — ABNORMAL LOW (ref 3.87–5.11)
RDW: 13.3 % (ref 11.5–15.5)
WBC: 13.3 10*3/uL — ABNORMAL HIGH (ref 4.0–10.5)
nRBC: 0 % (ref 0.0–0.2)

## 2023-01-09 LAB — COMPREHENSIVE METABOLIC PANEL
ALT: 12 U/L (ref 0–44)
AST: 18 U/L (ref 15–41)
Albumin: 3.9 g/dL (ref 3.5–5.0)
Alkaline Phosphatase: 43 U/L (ref 38–126)
Anion gap: 7 (ref 5–15)
BUN: 30 mg/dL — ABNORMAL HIGH (ref 8–23)
CO2: 22 mmol/L (ref 22–32)
Calcium: 8.9 mg/dL (ref 8.9–10.3)
Chloride: 105 mmol/L (ref 98–111)
Creatinine, Ser: 1.83 mg/dL — ABNORMAL HIGH (ref 0.44–1.00)
GFR, Estimated: 27 mL/min — ABNORMAL LOW (ref >60)
Glucose, Bld: 112 mg/dL — ABNORMAL HIGH (ref 70–99)
Potassium: 3.9 mmol/L (ref 3.5–5.1)
Sodium: 134 mmol/L — ABNORMAL LOW (ref 135–145)
Total Bilirubin: 0.8 mg/dL (ref 0.3–1.2)
Total Protein: 7.4 g/dL (ref 6.5–8.1)

## 2023-01-09 LAB — TYPE AND SCREEN
ABO/RH(D): A POS
Antibody Screen: NEGATIVE

## 2023-01-09 LAB — URINALYSIS, ROUTINE W REFLEX MICROSCOPIC
Bilirubin Urine: NEGATIVE
Glucose, UA: NEGATIVE mg/dL
Ketones, ur: NEGATIVE mg/dL
Nitrite: POSITIVE — AB
Protein, ur: NEGATIVE mg/dL
Specific Gravity, Urine: 1.009 (ref 1.005–1.030)
pH: 5 (ref 5.0–8.0)

## 2023-01-09 MED ORDER — LACTATED RINGERS IV BOLUS
500.0000 mL | Freq: Once | INTRAVENOUS | Status: AC
  Administered 2023-01-09: 500 mL via INTRAVENOUS

## 2023-01-09 MED ORDER — CEPHALEXIN 500 MG PO CAPS: 500.0000 mg | ORAL_CAPSULE | Freq: Four times a day (QID) | ORAL | 0 refills | Status: DC

## 2023-01-09 MED ORDER — SODIUM CHLORIDE 0.9 % IV SOLN
1.0000 g | Freq: Once | INTRAVENOUS | Status: AC
  Administered 2023-01-09: 1 g via INTRAVENOUS
  Filled 2023-01-09: qty 10

## 2023-01-09 NOTE — ED Triage Notes (Signed)
Pt presents with meleana, started with diarrhea red in color on yesterday, loose stools have stopped, but when she wipes there is traces of blood.

## 2023-01-09 NOTE — ED Provider Notes (Signed)
[place] EMERGENCY DEPARTMENT AT [facility] Provider Note   CSN: [id number] Arrival date & time: 01/09/23  1027     History  Chief Complaint  Patient presents with   Melena    Tamara Gross is a 82 y.o. female.  She has PMH of CKD, gout, hypertension, anemia of chronic disease, facial nerve palsy and gout.  Presents to the ER today for blood in the stool.  She states yesterday she had diarrhea couple of times and started having some bright red blood in her stool, states the diarrhea and bleeding stopped, states is a small amount and today but still having small amount of blood with wiping, went to urgent care they did have guaiac stool test that was positive, no noted hemorrhoids or fissures on exam.  No abdominal pain, no dizziness or syncope, she is not on blood thinners, was told to come to the ER to check her hemoglobin.  HPI     Home Medications Prior to Admission medications   Medication Sig Start Date End Date Taking? Authorizing Provider  cephALEXin (KEFLEX) 500 MG capsule Take 1 capsule (500 mg total) by mouth 4 (four) times daily. 01/09/23  Yes Montell Leopard A, PA-C  alendronate (FOSAMAX) 70 MG tablet Take 1 tablet (70 mg total) by mouth every 7 (seven) days. Take with a full glass of water on an empty stomach. Do not lay down for at least 2 hours 10/29/22   Mechele Claude, MD  allopurinol (ZYLOPRIM) 100 MG tablet Take 1 tablet (100 mg total) by mouth daily. 10/29/22   Mechele Claude, MD  Cholecalciferol (VITAMIN D) 2000 UNITS CAPS Take 1 capsule by mouth.    [provider]  docusate sodium (COLACE) 100 MG capsule Take 1 capsule (100 mg total) by mouth 2 (two) times daily. To soften stool 10/28/21   Mechele Claude, MD  lisinopril (ZESTRIL) 20 MG tablet Take 1 tablet (20 mg total) by mouth daily. 10/29/22   Mechele Claude, MD  Multiple Vitamin (MULTIVITAMIN) tablet Take 1 tablet by mouth daily.    [provider]  NIFEdipine (PROCARDIA XL/NIFEDICAL  XL) 60 MG 24 hr tablet Take 1 tablet (60 mg total) by mouth daily. 10/29/22   Mechele Claude, MD  sulfamethoxazole-trimethoprim (BACTRIM DS) 800-160 MG tablet Take 1 tablet by mouth 2 (two) times daily. 10/29/22   Mechele Claude, MD      Allergies    Patient has no known allergies.    Review of Systems   Review of Systems  Physical Exam Updated Vital Signs BP 119/69 (BP Location: Right Arm)   Pulse 74   Temp 98.6 F (37 C) (Oral)   Resp 16   Ht 5\' 1"  (1.549 m)   Wt 44.9 kg   SpO2 100%   BMI 18.71 kg/m  Physical Exam  ED Results / Procedures / Treatments   Labs (all labs ordered are listed, but only abnormal results are displayed) Labs Reviewed  COMPREHENSIVE METABOLIC PANEL - Abnormal; Notable for the following components:      Result Value   Sodium 134 (*)    Glucose, Bld 112 (*)    BUN 30 (*)    Creatinine, Ser 1.83 (*)    GFR, Estimated 27 (*)    All other components within normal limits  CBC - Abnormal; Notable for the following components:   WBC 13.3 (*)    RBC 3.61 (*)    Hemoglobin 11.4 (*)    HCT 35.9 (*)  All other components within normal limits  URINALYSIS, ROUTINE W REFLEX MICROSCOPIC - Abnormal; Notable for the following components:   APPearance HAZY (*)    Hgb urine dipstick MODERATE (*)    Nitrite POSITIVE (*)    Leukocytes,Ua LARGE (*)    Bacteria, UA MANY (*)    All other components within normal limits  URINE CULTURE  TYPE AND SCREEN    EKG EKG Interpretation Date/Time:  Saturday January 09 2023 13:48:28 EDT Ventricular Rate:  87 PR Interval:    QRS Duration:  103 QT Interval:  356 QTC Calculation: 429 R Axis:   63  Text Interpretation: Normal sinus rhythm , electrical noise Multi interpolated vent premature complexes Nonspecific T abnormalities, lateral leads Artifact in lead(s) I II III aVR aVL aVF V1 V3 V5 V6 Confirmed by Eber Hong ([id number]) on 01/09/2023 4:17:32 PM  Radiology No results found.  Procedures Procedures     Medications Ordered in ED Medications  lactated ringers bolus 500 mL (0 mLs Intravenous Stopped 01/09/23 1747)  cefTRIAXone (ROCEPHIN) 1 g in sodium chloride 0.9 % 100 mL IVPB (0 g Intravenous Stopped 01/09/23 1722)    ED Course/ Medical Decision Making/ A&P                                 Medical Decision Making This patient presents to the ED for concern of rectal bleeding, this involves an extensive number of treatment options, and is a complaint that carries with it a high risk of complications and morbidity.  The differential diagnosis includes colitis, diverticulitis, hemorrhoids, anemia, UTI, other    Additional history obtained:  Additional history obtained from EMR External records from outside source obtained and reviewed including notes, labs   Lab Tests:  I Ordered, and personally interpreted labs.  The pertinent results include: CBC shows baseline hemoglobin, mild leukocytosis, CMP shows slight increase in baseline creatinine, urinalysis positive for UTI with positive nitrate, large leuks 20-50 white blood cells and many bacteria with 0-5 squamous epithelial cells   Problem List / ED Course / Critical interventions / Medication management  Presents to the ER today with blood in her stool that started last night, only with wiping now, no drop in her baseline hemoglobin, already had rectal exam at urgent care so will not repeat she is having no abdominal pain at all is very stable with normal vital signs throughout her ED stay.  Low suspicion for infectious, she does have UTI, given Rocephin here, small fluids for mild AKI and will send home on Keflex, urine sent for culture.  Gust with attending do not feel CT would be beneficial at this time, did send a message to on-call GI Dr. Marletta Lor noting patient will need follow-up for this GI bleeding.  Patient was advised if she is large amount of blood, dizziness, fast heart rate, shortness of breath or any new or worsening  symptoms she should come back to the ER right away.  She quantify the amount of blood is only with wiping to me.  When asked if it could be quantified by mouth such as a quarter of a cup or half of a cup she states it was way less than that  I have reviewed the patients home medicines and have made adjustments as needed      Amount and/or Complexity of Data Reviewed Labs: ordered.  Risk Prescription drug management.  Final Clinical Impression(s) / ED Diagnoses Final diagnoses:  Acute cystitis with hematuria  Rectal bleeding    Rx / DC Orders ED Discharge Orders          Ordered    cephALEXin (KEFLEX) 500 MG capsule  4 times daily        01/09/23 1748              Josem Kaufmann 01/09/23 Windell Moment    Eber Hong, MD 01/11/23 [phone]

## 2023-01-09 NOTE — ED Notes (Signed)
Placed on cardiac monitoring

## 2023-01-11 LAB — URINE CULTURE: Culture: >=100000 — AB

## 2023-01-12 ENCOUNTER — Telehealth (HOSPITAL_BASED_OUTPATIENT_CLINIC_OR_DEPARTMENT_OTHER): Payer: Self-pay | Admitting: *Deleted

## 2023-01-12 NOTE — Telephone Encounter (Signed)
Post ED Visit - Positive Culture Follow-up  Culture report reviewed by antimicrobial stewardship pharmacist: [facility] [x]  [place], [place].D. []  Celedonio Miyamoto, Pharm.D., BCPS AQ-ID []  Garvin Fila, Pharm.D., BCPS []  Georgina Pillion, Pharm.D., BCPS []  [place], [place].D., BCPS, AAHIVP []  Estella Husk, Pharm.D., BCPS, AAHIVP []  Lysle Pearl, PharmD, BCPS []  Phillips Climes, PharmD, BCPS []  Agapito Games, PharmD, BCPS []  Verlan Friends, PharmD []  Mervyn Gay, PharmD, BCPS []  Vinnie Level, PharmD  [facility] []  Len Childs, PharmD []  Greer Pickerel, PharmD []  Adalberto Cole, PharmD []  Perlie Gold, Rph []  Lonell Face) Jean Rosenthal, PharmD []  Earl Many, PharmD []  Junita Push, PharmD []  Dorna Leitz, PharmD []  Terrilee Files, PharmD []  Lynann Beaver, PharmD []  Keturah Barre, PharmD []  Loralee Pacas, PharmD []  Bernadene Person, PharmD   Positive urine culture Treated with Cephalexin, organism sensitive to the same and no further patient follow-up is required at this time.  Nena Polio Garner Nash 01/12/2023, 9:09 AM

## 2023-01-20 ENCOUNTER — Ambulatory Visit: Payer: Medicare Other | Admitting: Internal Medicine

## 2023-01-20 ENCOUNTER — Encounter: Payer: Self-pay | Admitting: Internal Medicine

## 2023-01-20 VITALS — BP 128/78 | HR 85 | Temp 98.9°F | Ht 61.0 in | Wt 99.6 lb

## 2023-01-20 DIAGNOSIS — K625 Hemorrhage of anus and rectum: Secondary | ICD-10-CM | POA: Diagnosis not present

## 2023-01-20 DIAGNOSIS — R634 Abnormal weight loss: Secondary | ICD-10-CM

## 2023-01-20 DIAGNOSIS — K5904 Chronic idiopathic constipation: Secondary | ICD-10-CM | POA: Diagnosis not present

## 2023-01-20 MED ORDER — HYDROCORTISONE (PERIANAL) 2.5 % EX CREA: 1.0000 | TOPICAL_CREAM | Freq: Two times a day (BID) | CUTANEOUS | 1 refills | Status: AC

## 2023-01-20 NOTE — Progress Notes (Unsigned)
Primary Care Physician:  Mechele Claude, MD Primary Gastroenterologist:  Dr. Marletta Lor  Chief Complaint  Patient presents with   Rectal Bleeding    Had some rectal bleeding on 01/09/23 and went to ED.  Has not seen any more blood since then. Stools are hard. Took metamucil for awhile and not taking stool softners as needed.     HPI:   Tamara Gross is a 82 y.o. female who presents to the clinic today by referral from her PCP Dr. Darlyn Read for evaluation.  Patient states she was in her normal state of health until January 09, 2023 when she had sudden onset of rectal bleeding.  Bright red blood on tissue paper and some in toilet bowl.  Presented to the ER, blood work reassuring.  Treated conservatively and discharged home.  Patient states that she has not had any further bleeding since that 1 episode.  Does note chronic issues with constipation.  Bowel movement every few days, does note a lot of straining.  Incomplete evacuation.  Stools are sometimes hard and painful, sometimes small balls.  Last colonoscopy 2012 unremarkable.  Looking at pictures does appear that she has internal hemorrhoids.  States she was started on Colace by her primary care physician though she very rarely takes this.  Past Medical History:  Diagnosis Date   Gout    Hypertension    Osteoporosis     Past Surgical History:  Procedure Laterality Date   TONSILLECTOMY      Current Outpatient Medications  Medication Sig Dispense Refill   alendronate (FOSAMAX) 70 MG tablet Take 1 tablet (70 mg total) by mouth every 7 (seven) days. Take with a full glass of water on an empty stomach. Do not lay down for at least 2 hours 13 tablet 3   allopurinol (ZYLOPRIM) 100 MG tablet Take 1 tablet (100 mg total) by mouth daily. 90 tablet 3   Cholecalciferol (VITAMIN D) 2000 UNITS CAPS Take 1 capsule by mouth.     docusate sodium (COLACE) 100 MG capsule Take 1 capsule (100 mg total) by mouth 2 (two) times daily. To soften stool 60  capsule 5   lisinopril (ZESTRIL) 20 MG tablet Take 1 tablet (20 mg total) by mouth daily. 90 tablet 3   Multiple Vitamin (MULTIVITAMIN) tablet Take 1 tablet by mouth daily.     NIFEdipine (PROCARDIA XL/NIFEDICAL XL) 60 MG 24 hr tablet Take 1 tablet (60 mg total) by mouth daily. 90 tablet 3   No current facility-administered medications for this visit.    Allergies as of 01/20/2023   (No Known Allergies)    Family History  Problem Relation Age of Onset   Cancer Mother    Deep vein thrombosis Father    Hypertension Sister    Hypertension Brother     Social History   Socioeconomic History   Marital status: Widowed    Spouse name: Not on file   Number of children: 0   Years of education: college   Highest education level: Associate degree: academic program  Occupational History   Occupation: Retired  Tobacco Use   Smoking status: Never   Smokeless tobacco: Never  Vaping Use   Vaping status: Never Used  Substance and Sexual Activity   Alcohol use: Never   Drug use: Never   Sexual activity: Not Currently  Other Topics Concern   Not on file  Social History Narrative   Sister recently had stroke - she is helping care for her  Social Determinants of Health   Financial Resource Strain: Low Risk  (07/13/2022)   Overall Financial Resource Strain (CARDIA)    Difficulty of Paying Living Expenses: Not hard at all  Food Insecurity: No Food Insecurity (07/13/2022)   Hunger Vital Sign    Worried About Running Out of Food in the Last Year: Never true    Ran Out of Food in the Last Year: Never true  Transportation Needs: No Transportation Needs (07/13/2022)   PRAPARE - Administrator, Civil Service (Medical): No    Lack of Transportation (Non-Medical): No  Physical Activity: Insufficiently Active (07/13/2022)   Exercise Vital Sign    Days of Exercise per Week: 3 days    Minutes of Exercise per Session: 30 min  Stress: No Stress Concern Present (07/13/2022)   [employer] of Occupational Health - Occupational Stress Questionnaire    Feeling of Stress : Not at all  Social Connections: Moderately Isolated (07/13/2022)   Social Connection and Isolation Panel [NHANES]    Frequency of Communication with Friends and Family: More than three times a week    Frequency of Social Gatherings with Friends and Family: More than three times a week    Attends Religious Services: More than 4 times per year    Active Member of [employer] or Organizations: No    Attends Banker Meetings: Never    Marital Status: Widowed  Intimate Partner Violence: Not At Risk (07/13/2022)   Humiliation, Afraid, Rape, and Kick questionnaire    Fear of Current or Ex-Partner: No    Emotionally Abused: No    Physically Abused: No    Sexually Abused: No    Subjective: Review of Systems  Constitutional:  Negative for chills and fever.  HENT:  Negative for congestion and hearing loss.   Eyes:  Negative for blurred vision and double vision.  Respiratory:  Negative for cough and shortness of breath.   Cardiovascular:  Negative for chest pain and palpitations.  Gastrointestinal:  Positive for blood in stool and constipation. Negative for abdominal pain, diarrhea, heartburn, melena and vomiting.  Genitourinary:  Negative for dysuria and urgency.  Musculoskeletal:  Negative for joint pain and myalgias.  Skin:  Negative for itching and rash.  Neurological:  Negative for dizziness and headaches.  Psychiatric/Behavioral:  Negative for depression. The patient is not nervous/anxious.        Objective: BP 128/78 (BP Location: Left Arm, Patient Position: Sitting, Cuff Size: Normal)   Pulse 85   Temp 98.9 F (37.2 C) (Oral)   Ht 5\' 1"  (1.549 m)   Wt 99 lb 9.6 oz (45.2 kg)   BMI 18.82 kg/m  Physical Exam Constitutional:      Appearance: Normal appearance.  HENT:     Head: Normocephalic and atraumatic.  Eyes:     Extraocular Movements: Extraocular movements intact.      Conjunctiva/sclera: Conjunctivae normal.  Cardiovascular:     Rate and Rhythm: Normal rate and regular rhythm.  Pulmonary:     Effort: Pulmonary effort is normal.     Breath sounds: Normal breath sounds.  Abdominal:     General: Bowel sounds are normal.     Palpations: Abdomen is soft.  Musculoskeletal:        General: No swelling. Normal range of motion.     Cervical back: Normal range of motion and neck supple.  Skin:    General: Skin is warm and dry.     Coloration: Skin is not  jaundiced.  Neurological:     General: No focal deficit present.     Mental Status: She is alert and oriented to person, place, and time.  Psychiatric:        Mood and Affect: Mood normal.        Behavior: Behavior normal.      Assessment: *Rectal bleeding-resolved *Chronic idiopathic constipation *Internal hemorrhoids  Plan: Discussed patient's symptoms in depth with her today.  1 episode of rectal bleeding, no other episodes since.  Does note issues with chronic constipation, likely benign anorectal source/hemorrhoids in the setting of chronic constipation.  Recommended patient actually start taking her stool softener daily and see how she does.    Anusol cream sent to pharmacy.  Start Benefiber 1-2 times daily.  Ensure that she is drinking plenty of water.  I did counsel her given the bleeding, there is a small chance that this could be due to something more sinister such as malignancy which would require colonoscopy to further evaluate.  She would like to hold off for now.  Follow-up in 3 months  01/20/2023 11:55 AM   Disclaimer: This note was dictated with voice recognition software. Similar sounding words can inadvertently be transcribed and may not be corrected upon review.

## 2023-01-20 NOTE — Patient Instructions (Signed)
Etiology of your rectal discomfort and bleeding likely due to hemorrhoids in the setting of chronic constipation.  Would recommend taking Colace 1-2 times daily to see if this helps.  You could also trial over-the-counter MiraLAX as well.  I will send Anusol cream to use 1-2 times daily for rectal irritation/soreness.  Follow-up in 3 months.  It was very nice meeting you today.  Dr. Marletta Lor

## 2023-02-03 ENCOUNTER — Ambulatory Visit (INDEPENDENT_AMBULATORY_CARE_PROVIDER_SITE_OTHER): Payer: Medicare Other

## 2023-02-03 DIAGNOSIS — Z23 Encounter for immunization: Secondary | ICD-10-CM

## 2023-02-12 ENCOUNTER — Telehealth: Payer: Self-pay

## 2023-02-12 NOTE — Telephone Encounter (Signed)
Transition Care Management Unsuccessful Follow-up Telephone Call  Date of discharge and from where:  Tamara Gross 9/21  Attempts:  1st Attempt  Reason for unsuccessful TCM follow-up call:  No answer/busy   [name]  [facility], [facility] Guide, Phone: [phone] Website: [email or web address]

## 2023-02-12 NOTE — Telephone Encounter (Signed)
Transition Care Management Unsuccessful Follow-up Telephone Call  Date of discharge and from where:  [facility]  Attempts:  2nd Attempt  Reason for unsuccessful TCM follow-up call:  No answer/busy   Lenard Forth Gate  [facility], [facility] Guide, Phone: [phone] Website: [email or web address]

## 2023-04-02 IMAGING — DX DG HIP (WITH OR WITHOUT PELVIS) 2-3V*R*
3 series · 3 of 3 positions shown · non-contrast
Comparison: None.

CLINICAL DATA: Pain for several months

EXAM:
DG HIP (WITH OR WITHOUT PELVIS) 2-3V RIGHT

[pelvis ap]
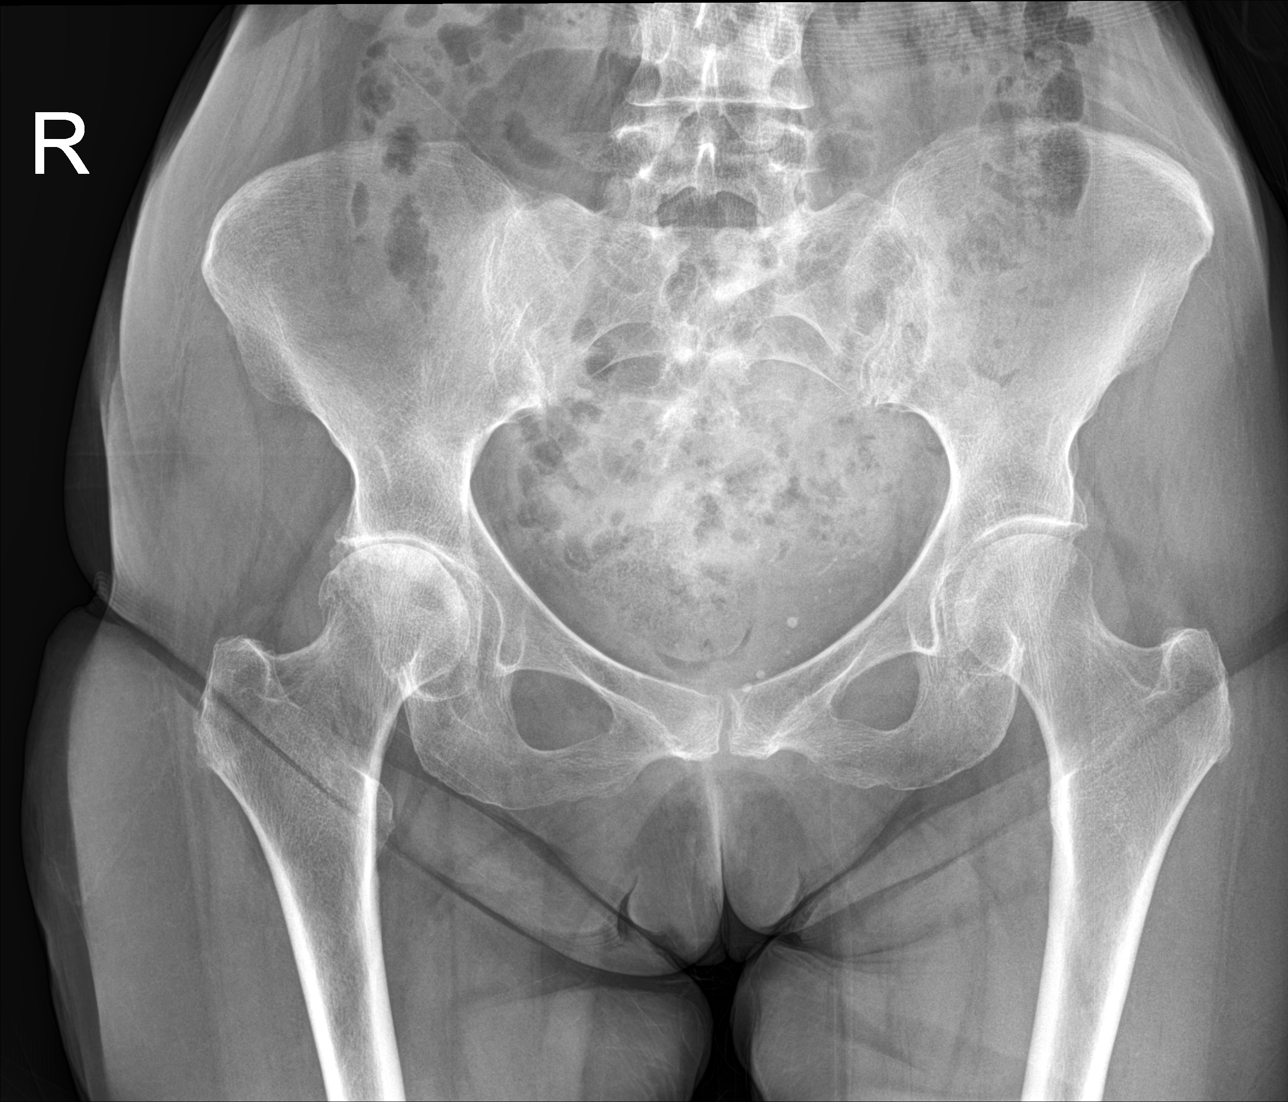

[hip ap]
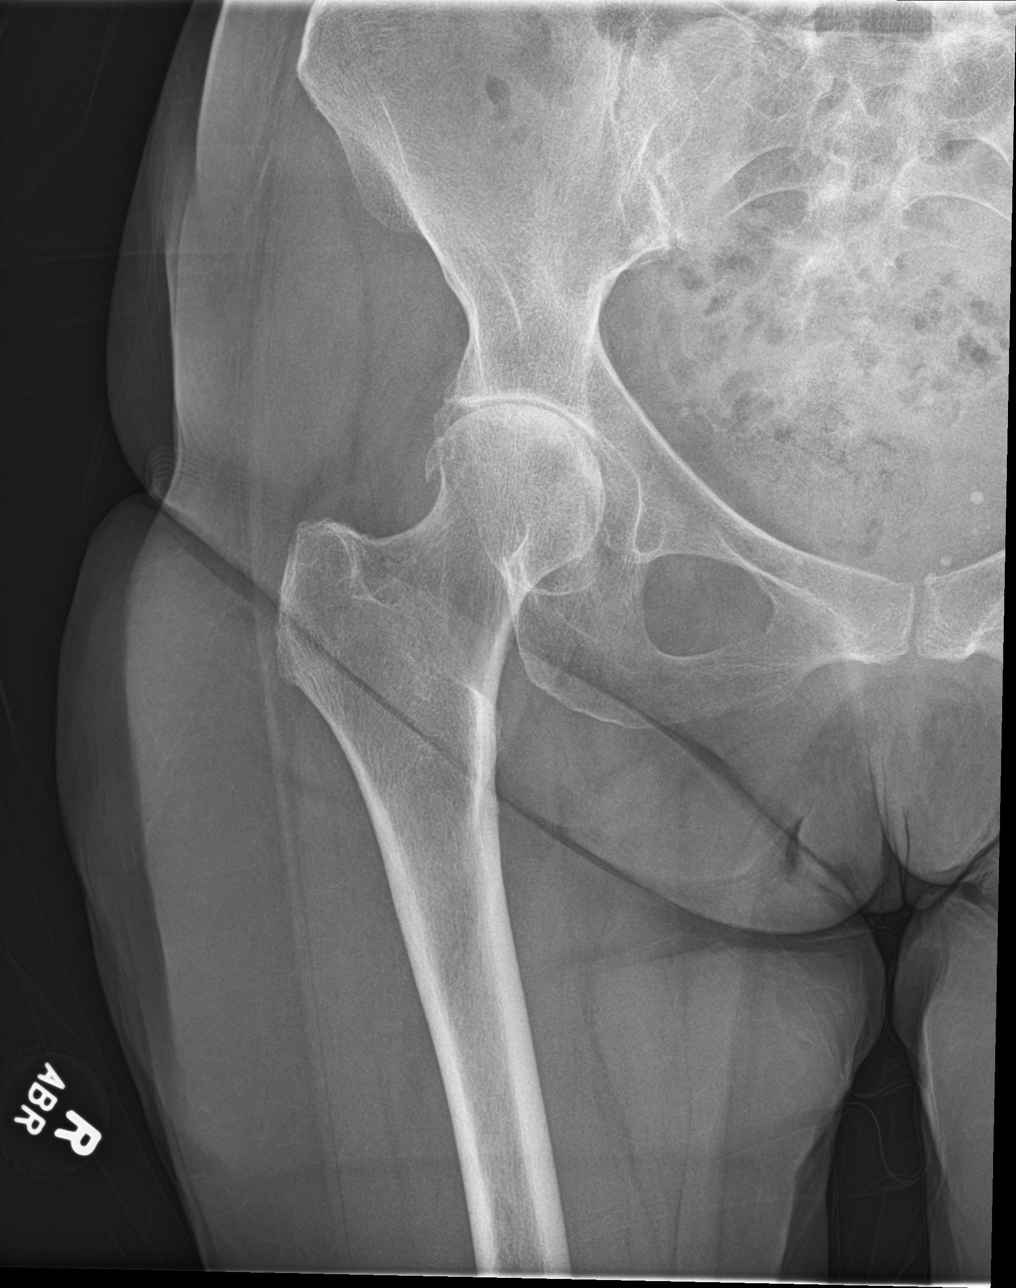

[hip lat]
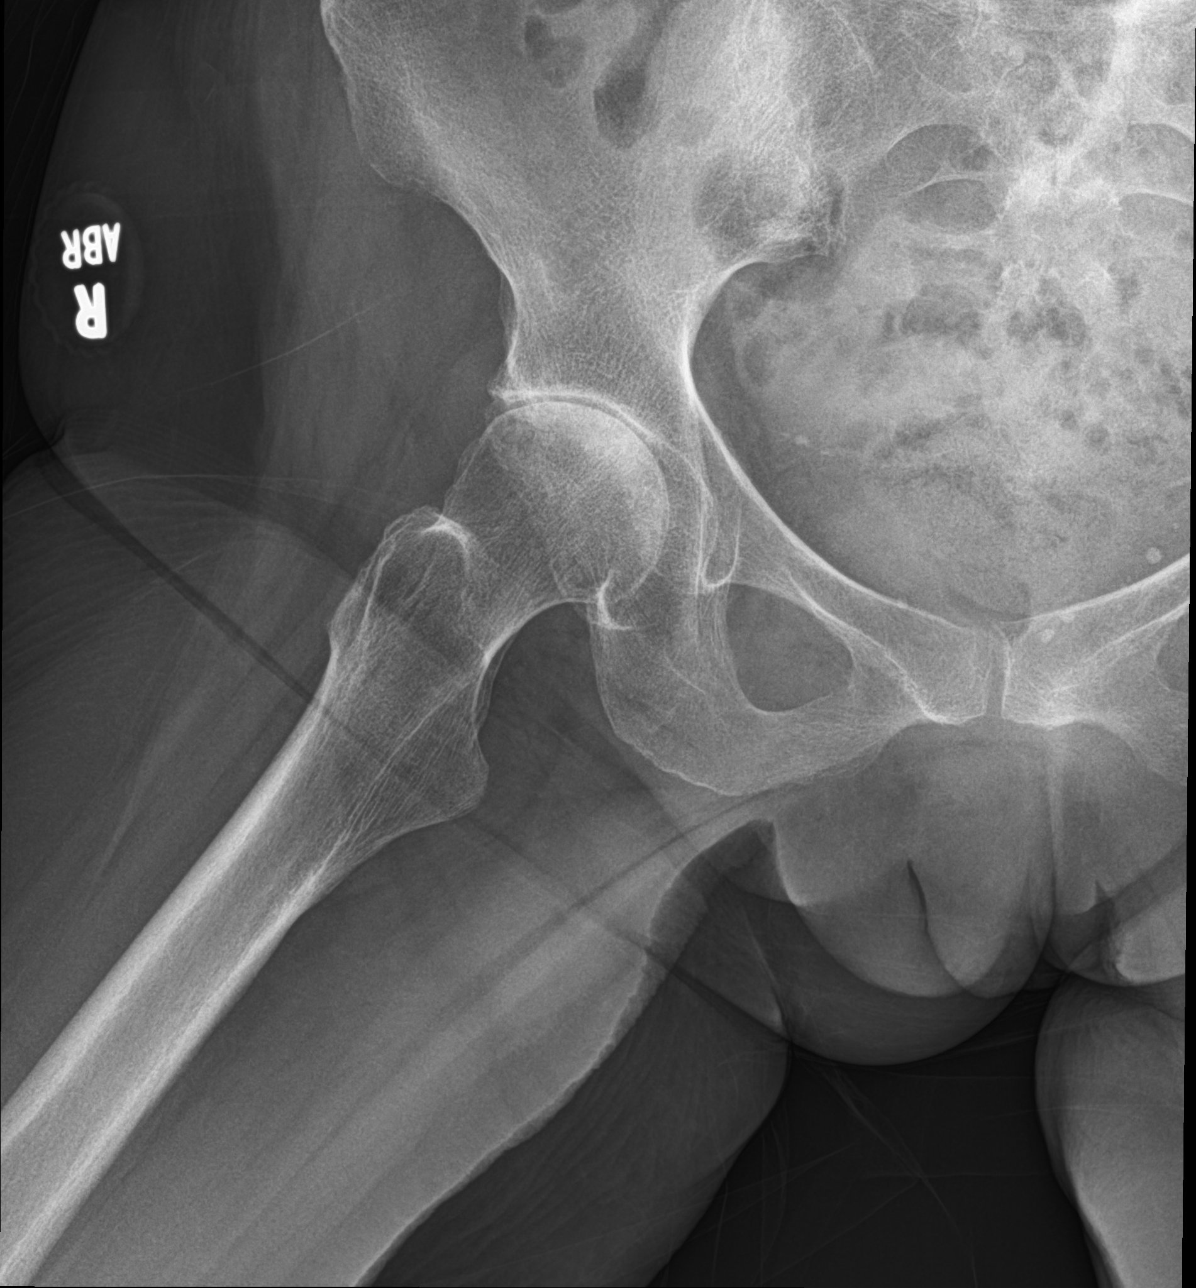

[3 of 3 positions shown; findings below may reference images not displayed]

FINDINGS: No acute fracture or dislocation. There are severe degenerative
changes of the RIGHT hip with osteophyte formation, joint space
narrowing, and subchondral sclerosis may reflect a degree of
underlying avascular necrosis. No area of erosion or osseous
destruction. No unexpected radiopaque foreign body. Pelvic
phleboliths.
IMPRESSION: Severe degenerative changes of the RIGHT hip. There is subchondral
sclerosis which may reflect underlying avascular necrosis.

## 2023-04-27 ENCOUNTER — Ambulatory Visit (INDEPENDENT_AMBULATORY_CARE_PROVIDER_SITE_OTHER): Payer: Medicare Other | Admitting: Gastroenterology

## 2023-04-27 ENCOUNTER — Encounter: Payer: Self-pay | Admitting: Gastroenterology

## 2023-04-27 VITALS — BP 172/91 | HR 99 | Temp 98.6°F | Ht 61.0 in | Wt 101.0 lb

## 2023-04-27 DIAGNOSIS — K5900 Constipation, unspecified: Secondary | ICD-10-CM | POA: Insufficient documentation

## 2023-04-27 NOTE — Patient Instructions (Signed)
 I recommend adding Benefiber 2 teaspoons each morning.  You can take Gas-x or Beano as needed.  We will see you back as needed!  Please call us  if any further rectal bleeding!  It was a pleasure to see you today. I want to create trusting relationships with patients and provide genuine, compassionate, and quality care. I truly value your feedback, so please be on the lookout for a survey regarding your visit with me today. I appreciate your time in completing this!         Therisa MICAEL Stager, PhD, ANP-BC [facility]

## 2023-04-27 NOTE — Progress Notes (Signed)
 Gastroenterology Office Note     Primary Care Physician:  Zollie Lowers, MD  Primary Gastroenterologist: Dr. Cindie   Chief Complaint   Chief Complaint  Patient presents with   Follow-up    Pt here for her follow up. Pt states she is better and no bleeding     History of Present Illness   Tamara Gross is an 83 y.o. female presenting today with a history of rectal bleeding and constipation. Last seen Oct 2024 to establish care due to rectal bleeding.   She has a family history of colon cancer in mother. Last colonoscopy in 2012. Patient is wanting to defer colonoscopy unless absolutely needed.   She is taking colace once a day. Rare straining. No further rectal bleeding. Has used the anusol  cream, which has helped. Has gas depending on what she eats. No abdominal pain. Good appetite.   Last colonoscopy 2012 unremarkable   Past Medical History:  Diagnosis Date   Gout    Hypertension    Osteoporosis     Past Surgical History:  Procedure Laterality Date   TONSILLECTOMY      Current Outpatient Medications  Medication Sig Dispense Refill   alendronate  (FOSAMAX ) 70 MG tablet Take 1 tablet (70 mg total) by mouth every 7 (seven) days. Take with a full glass of water on an empty stomach. Do not lay down for at least 2 hours 13 tablet 3   allopurinol  (ZYLOPRIM ) 100 MG tablet Take 1 tablet (100 mg total) by mouth daily. 90 tablet 3   Cholecalciferol (VITAMIN D ) 2000 UNITS CAPS Take 1 capsule by mouth.     docusate sodium  (COLACE) 100 MG capsule Take 1 capsule (100 mg total) by mouth 2 (two) times daily. To soften stool 60 capsule 5   hydrocortisone  (ANUSOL -HC) 2.5 % rectal cream Place 1 Application rectally 2 (two) times daily. 30 g 1   lisinopril  (ZESTRIL ) 20 MG tablet Take 1 tablet (20 mg total) by mouth daily. 90 tablet 3   Multiple Vitamin (MULTIVITAMIN) tablet Take 1 tablet by mouth daily.     NIFEdipine  (PROCARDIA  XL/NIFEDICAL XL) 60 MG 24 hr tablet Take 1 tablet (60  mg total) by mouth daily. 90 tablet 3   No current facility-administered medications for this visit.    Allergies as of 04/27/2023   (No Known Allergies)    Family History  Problem Relation Age of Onset   Cancer Mother    Deep vein thrombosis Father    Hypertension Sister    Hypertension Brother     Social History   Socioeconomic History   Marital status: Widowed    Spouse name: Not on file   Number of children: 0   Years of education: college   Highest education level: Associate degree: academic program  Occupational History   Occupation: Retired  Tobacco Use   Smoking status: Never   Smokeless tobacco: Never  Vaping Use   Vaping status: Never Used  Substance and Sexual Activity   Alcohol use: Never   Drug use: Never   Sexual activity: Not Currently  Other Topics Concern   Not on file  Social History Narrative   Sister recently had stroke - she is helping care for her    Social Drivers of Health   Financial Resource Strain: Low Risk  (07/13/2022)   Overall Financial Resource Strain (CARDIA)    Difficulty of Paying Living Expenses: Not hard at all  Food Insecurity: No Food Insecurity (07/13/2022)   Hunger  Vital Sign    Worried About Programme Researcher, Broadcasting/film/video in the Last Year: Never true    Ran Out of Food in the Last Year: Never true  Transportation Needs: No Transportation Needs (07/13/2022)   PRAPARE - Administrator, Civil Service (Medical): No    Lack of Transportation (Non-Medical): No  Physical Activity: Insufficiently Active (07/13/2022)   Exercise Vital Sign    Days of Exercise per Week: 3 days    Minutes of Exercise per Session: 30 min  Stress: No Stress Concern Present (07/13/2022)   [employer] of Occupational Health - Occupational Stress Questionnaire    Feeling of Stress : Not at all  Social Connections: Moderately Isolated (07/13/2022)   Social Connection and Isolation Panel [NHANES]    Frequency of Communication with Friends and  Family: More than three times a week    Frequency of Social Gatherings with Friends and Family: More than three times a week    Attends Religious Services: More than 4 times per year    Active Member of [employer] or Organizations: No    Attends Banker Meetings: Never    Marital Status: Widowed  Intimate Partner Violence: Not At Risk (07/13/2022)   Humiliation, Afraid, Rape, and Kick questionnaire    Fear of Current or Ex-Partner: No    Emotionally Abused: No    Physically Abused: No    Sexually Abused: No     Review of Systems   Gen: Denies any fever, chills, fatigue, weight loss, lack of appetite.  CV: Denies chest pain, heart palpitations, peripheral edema, syncope.  Resp: Denies shortness of breath at rest or with exertion. Denies wheezing or cough.  GI: Denies dysphagia or odynophagia. Denies jaundice, hematemesis, fecal incontinence. GU : Denies urinary burning, urinary frequency, urinary hesitancy MS: Denies joint pain, muscle weakness, cramps, or limitation of movement.  Derm: Denies rash, itching, dry skin Psych: Denies depression, anxiety, memory loss, and confusion Heme: Denies bruising, bleeding, and enlarged lymph nodes.   Physical Exam   BP (!) 172/91   Pulse 99   Temp 98.6 F (37 C)   Ht 5' 1 (1.549 m)   Wt 101 lb (45.8 kg)   BMI 19.08 kg/m  General:   Alert and oriented. Pleasant and cooperative. Well-nourished and well-developed.  Head:  Normocephalic and atraumatic. Eyes:  Without icterus Abdomen:  +BS, soft, non-tender and non-distended. No HSM noted. No guarding or rebound. No masses appreciated.  Rectal:  Declined Msk:  Symmetrical without gross deformities. Normal posture. Extremities:  Without edema. Neurologic:  Alert and  oriented x4 Skin:  Intact without significant lesions or rashes. Psych:  Alert and cooperative. Normal mood and affect.   Assessment   Tamara Gross is an 83 y.o. female presenting today with a history of rectal  bleeding and constipation, returning for close follow-up today.  Rectal bleeding resolved and was likely secondary to internal hemorrhoids; however, we did discuss how we are unable to rule out any occult etiology without colonoscopy. She is wanting to defer this for now and only pursue if recurrent bleeding.  In regards to constipation, she has had improvement with stool softener daily. I have asked her to add supplemental fiber, as she has done well with this in the past.  Offered rectal exam today, but she declined.   PLAN    Continue stool softener daily Add supplemental fiber If further rectal bleeding, recommend colonoscopy Return prn    Therisa MICAEL Stager, PhD,  ANP-BC [facility]

## 2023-05-03 ENCOUNTER — Ambulatory Visit: Payer: Medicare Other | Admitting: Family Medicine

## 2023-06-02 ENCOUNTER — Ambulatory Visit (INDEPENDENT_AMBULATORY_CARE_PROVIDER_SITE_OTHER): Payer: Medicare Other | Admitting: Family Medicine

## 2023-06-02 ENCOUNTER — Encounter: Payer: Self-pay | Admitting: Family Medicine

## 2023-06-02 VITALS — BP 141/82 | HR 90 | Temp 97.6°F | Ht 61.0 in

## 2023-06-02 DIAGNOSIS — N1832 Chronic kidney disease, stage 3b: Secondary | ICD-10-CM | POA: Diagnosis not present

## 2023-06-02 DIAGNOSIS — K625 Hemorrhage of anus and rectum: Secondary | ICD-10-CM

## 2023-06-02 DIAGNOSIS — K5904 Chronic idiopathic constipation: Secondary | ICD-10-CM

## 2023-06-02 DIAGNOSIS — I10 Essential (primary) hypertension: Secondary | ICD-10-CM | POA: Diagnosis not present

## 2023-06-02 DIAGNOSIS — E559 Vitamin D deficiency, unspecified: Secondary | ICD-10-CM | POA: Diagnosis not present

## 2023-06-02 DIAGNOSIS — M1000 Idiopathic gout, unspecified site: Secondary | ICD-10-CM

## 2023-06-02 LAB — URINALYSIS
Bilirubin, UA: NEGATIVE
Glucose, UA: NEGATIVE
Ketones, UA: NEGATIVE
Nitrite, UA: NEGATIVE
Protein,UA: NEGATIVE
RBC, UA: NEGATIVE
Specific Gravity, UA: 1.01 (ref 1.005–1.030)
Urobilinogen, Ur: 0.2 mg/dL (ref 0.2–1.0)
pH, UA: 5.5 (ref 5.0–7.5)

## 2023-06-02 NOTE — Progress Notes (Signed)
Subjective:  Patient ID: Tamara Gross, female    DOB: [birth year]  Age: 83 y.o. MRN: [id number]  CC: Hypertension (No concerns/Would like Bp checked)   HPI Hazelle B Mcafee presents for  follow-up of hypertension. Patient has no history of headache chest pain or shortness of breath or recent cough. Patient also denies symptoms of TIA such as focal numbness or weakness. Patient denies side effects from medication. States taking it regularly. Followed by GI for an episode of rectal bleeding episode on 9/21. Doing well since with fiber supplement. Had IV fluids at the E.D. for this. Due to age, colonoscopy was deferredunlless bleeding recurred. To date it has not.   History Thomas has a past medical history of Gout, Hypertension, and Osteoporosis.   She has a past surgical history that includes Tonsillectomy.   Her family history includes Cancer in her mother; Deep vein thrombosis in her father; Hypertension in her brother and sister.She reports that she has never smoked. She has never used smokeless tobacco. She reports that she does not drink alcohol and does not use drugs.  Current Outpatient Medications on File Prior to Visit  Medication Sig Dispense Refill   alendronate (FOSAMAX) 70 MG tablet Take 1 tablet (70 mg total) by mouth every 7 (seven) days. Take with a full glass of water on an empty stomach. Do not lay down for at least 2 hours 13 tablet 3   allopurinol (ZYLOPRIM) 100 MG tablet Take 1 tablet (100 mg total) by mouth daily. 90 tablet 3   Cholecalciferol (VITAMIN D) 2000 UNITS CAPS Take 1 capsule by mouth.     hydrocortisone (ANUSOL-HC) 2.5 % rectal cream Place 1 Application rectally 2 (two) times daily. 30 g 1   lisinopril (ZESTRIL) 20 MG tablet Take 1 tablet (20 mg total) by mouth daily. 90 tablet 3   Multiple Vitamin (MULTIVITAMIN) tablet Take 1 tablet by mouth daily.     NIFEdipine (PROCARDIA XL/NIFEDICAL XL) 60 MG 24 hr tablet Take 1 tablet (60 mg total) by mouth daily. 90  tablet 3   No current facility-administered medications on file prior to visit.    ROS Review of Systems  Constitutional: Negative.   HENT:  Negative for congestion.   Eyes:  Negative for visual disturbance.  Respiratory:  Negative for shortness of breath.   Cardiovascular:  Negative for chest pain.  Gastrointestinal:  Negative for abdominal pain, constipation, diarrhea, nausea and vomiting.  Genitourinary:  Positive for dysuria (recent episode, not current). Negative for difficulty urinating.  Musculoskeletal:  Negative for arthralgias and myalgias.  Neurological:  Negative for headaches.  Psychiatric/Behavioral:  Negative for sleep disturbance.     Objective:  BP (!) 141/82   Pulse 90   Temp 97.6 F (36.4 C)   Ht 5\' 1"  (1.549 m)   SpO2 97%   BMI 19.08 kg/m   BP Readings from Last 3 Encounters:  06/02/23 (!) 141/82  04/27/23 (!) 172/91  01/20/23 128/78    Wt Readings from Last 3 Encounters:  04/27/23 101 lb (45.8 kg)  01/20/23 99 lb 9.6 oz (45.2 kg)  01/09/23 99 lb (44.9 kg)     Physical Exam Constitutional:      General: She is not in acute distress.    Appearance: She is well-developed.  HENT:     Head: Normocephalic and atraumatic.  Eyes:     Conjunctiva/sclera: Conjunctivae normal.     Pupils: Pupils are equal, round, and reactive to light.  Neck:  Thyroid: No thyromegaly.  Cardiovascular:     Rate and Rhythm: Normal rate and regular rhythm.     Heart sounds: Normal heart sounds. No murmur heard. Pulmonary:     Effort: Pulmonary effort is normal. No respiratory distress.     Breath sounds: Normal breath sounds. No wheezing or rales.  Abdominal:     General: Bowel sounds are normal. There is no distension.     Palpations: Abdomen is soft.     Tenderness: There is no abdominal tenderness.  Musculoskeletal:        General: Normal range of motion.     Cervical back: Normal range of motion and neck supple.  Lymphadenopathy:     Cervical: No  cervical adenopathy.  Skin:    General: Skin is warm and dry.  Neurological:     Mental Status: She is alert and oriented to person, place, and time.  Psychiatric:        Behavior: Behavior normal.        Thought Content: Thought content normal.        Judgment: Judgment normal.       Assessment & Plan:   Brianny was seen today for hypertension.  Diagnoses and all orders for this visit:  Primary hypertension -     CBC with Differential/Platelet -     CMP14+EGFR -     Uric acid -     VITAMIN D 25 Hydroxy (Vit-D Deficiency, Fractures) -     Urinalysis  Idiopathic gout, unspecified chronicity, unspecified site -     CBC with Differential/Platelet -     CMP14+EGFR -     Uric acid -     VITAMIN D 25 Hydroxy (Vit-D Deficiency, Fractures) -     Urinalysis  Chronic idiopathic constipation -     CBC with Differential/Platelet -     CMP14+EGFR -     Uric acid -     VITAMIN D 25 Hydroxy (Vit-D Deficiency, Fractures) -     Urinalysis  Stage 3b chronic kidney disease (HCC) -     CBC with Differential/Platelet -     CMP14+EGFR -     Uric acid -     VITAMIN D 25 Hydroxy (Vit-D Deficiency, Fractures) -     Urinalysis  Vitamin D deficiency -     CBC with Differential/Platelet -     CMP14+EGFR -     Uric acid -     VITAMIN D 25 Hydroxy (Vit-D Deficiency, Fractures) -     Urinalysis  Gastrointestinal hemorrhage associated with anorectal source   Allergies as of 06/02/2023   No Known Allergies      Medication List        Accurate as of June 02, 2023  9:19 AM. If you have any questions, ask your nurse or doctor.          STOP taking these medications    docusate sodium 100 MG capsule Commonly known as: Colace Stopped by: Rayvin Abid       TAKE these medications    alendronate 70 MG tablet Commonly known as: FOSAMAX Take 1 tablet (70 mg total) by mouth every 7 (seven) days. Take with a full glass of water on an empty stomach. Do not lay down for at  least 2 hours   allopurinol 100 MG tablet Commonly known as: ZYLOPRIM Take 1 tablet (100 mg total) by mouth daily.   hydrocortisone 2.5 % rectal cream Commonly known as: ANUSOL-HC Place 1 Application  rectally 2 (two) times daily.   lisinopril 20 MG tablet Commonly known as: ZESTRIL Take 1 tablet (20 mg total) by mouth daily.   multivitamin tablet Take 1 tablet by mouth daily.   NIFEdipine 60 MG 24 hr tablet Commonly known as: PROCARDIA XL/NIFEDICAL XL Take 1 tablet (60 mg total) by mouth daily.   Vitamin D 50 MCG (2000 UT) Caps Take 1 capsule by mouth.        Using OTC fiber supplement instead of colace currently per gi recommendation    Follow-up: Return in about 6 months (around 11/30/2023).  Mechele Claude, M.D.

## 2023-06-03 LAB — CMP14+EGFR
ALT: 8 [IU]/L (ref 0–32)
AST: 17 [IU]/L (ref 0–40)
Albumin: 4.4 g/dL (ref 3.7–4.7)
Alkaline Phosphatase: 63 [IU]/L (ref 44–121)
BUN/Creatinine Ratio: 16 (ref 12–28)
BUN: 23 mg/dL (ref 8–27)
Bilirubin Total: 0.3 mg/dL (ref 0.0–1.2)
CO2: 19 mmol/L — ABNORMAL LOW (ref 20–29)
Calcium: 9.7 mg/dL (ref 8.7–10.3)
Chloride: 105 mmol/L (ref 96–106)
Creatinine, Ser: 1.46 mg/dL — ABNORMAL HIGH (ref 0.57–1.00)
Globulin, Total: 2.7 g/dL (ref 1.5–4.5)
Glucose: 94 mg/dL (ref 70–99)
Potassium: 4.5 mmol/L (ref 3.5–5.2)
Sodium: 140 mmol/L (ref 134–144)
Total Protein: 7.1 g/dL (ref 6.0–8.5)
eGFR: 35 mL/min/{1.73_m2} — ABNORMAL LOW (ref >59)

## 2023-06-03 LAB — CBC WITH DIFFERENTIAL/PLATELET
Basophils Absolute: 0 10*3/uL (ref 0.0–0.2)
Basos: 1 %
EOS (ABSOLUTE): 0.2 10*3/uL (ref 0.0–0.4)
Eos: 4 %
Hematocrit: 34.1 % (ref 34.0–46.6)
Hemoglobin: 11.1 g/dL (ref 11.1–15.9)
Immature Grans (Abs): 0 10*3/uL (ref 0.0–0.1)
Immature Granulocytes: 0 %
Lymphocytes Absolute: 1.5 10*3/uL (ref 0.7–3.1)
Lymphs: 23 %
MCH: 31.8 pg (ref 26.6–33.0)
MCHC: 32.6 g/dL (ref 31.5–35.7)
MCV: 98 fL — ABNORMAL HIGH (ref 79–97)
Monocytes Absolute: 0.6 10*3/uL (ref 0.1–0.9)
Monocytes: 10 %
Neutrophils Absolute: 3.9 10*3/uL (ref 1.4–7.0)
Neutrophils: 62 %
Platelets: 343 10*3/uL (ref 150–450)
RBC: 3.49 x10E6/uL — ABNORMAL LOW (ref 3.77–5.28)
RDW: 12.4 % (ref 11.7–15.4)
WBC: 6.3 10*3/uL (ref 3.4–10.8)

## 2023-06-03 LAB — VITAMIN D 25 HYDROXY (VIT D DEFICIENCY, FRACTURES): Vit D, 25-Hydroxy: 40.1 ng/mL (ref 30.0–100.0)

## 2023-06-03 LAB — URIC ACID: Uric Acid: 6.6 mg/dL (ref 3.1–7.9)

## 2023-06-04 ENCOUNTER — Encounter: Payer: Self-pay | Admitting: Family Medicine

## 2023-07-16 ENCOUNTER — Ambulatory Visit: Payer: Medicare Other

## 2023-07-16 VITALS — Ht 61.0 in | Wt 101.0 lb

## 2023-07-16 DIAGNOSIS — Z0000 Encounter for general adult medical examination without abnormal findings: Secondary | ICD-10-CM | POA: Diagnosis not present

## 2023-07-16 NOTE — Progress Notes (Signed)
 Subjective:   Tamara Gross is a 83 y.o. who presents for a Medicare Wellness preventive visit.  Visit Complete: Virtual I connected with  Tamara Gross on 07/16/23 by a audio enabled telemedicine application and verified that I am speaking with the correct person using two identifiers.  Patient Location: Home  Provider Location: Home Office  I discussed the limitations of evaluation and management by telemedicine. The patient expressed understanding and agreed to proceed.  Vital Signs: Because this visit was a virtual/telehealth visit, some criteria may be missing or patient reported. Any vitals not documented were not able to be obtained and vitals that have been documented are patient reported.  VideoDeclined- This patient declined Librarian, academic. Therefore the visit was completed with audio only.  Persons Participating in Visit: Patient.  AWV Questionnaire: No: Patient Medicare AWV questionnaire was not completed prior to this visit.  Cardiac Risk Factors include: advanced age (>35men, >62 women);hypertension     Objective:    Today's Vitals   07/16/23 1307  Weight: 101 lb (45.8 kg)  Height: 5\' 1"  (1.549 m)   Body mass index is 19.08 kg/m.     07/16/2023    1:10 PM 01/09/2023   12:04 PM 07/13/2022    9:11 AM 07/09/2021    9:39 AM 11/11/2020    9:34 AM 07/08/2020    2:01 PM 06/23/2019   11:10 AM  Advanced Directives  Does Patient Have a Medical Advance Directive? No No No No No No No  Would patient like information on creating a medical advance directive? Yes (MAU/Ambulatory/Procedural Areas - Information given)  No - Patient declined No - Patient declined  No - Patient declined No - Patient declined    Current Medications (verified) Outpatient Encounter Medications as of 07/16/2023  Medication Sig   alendronate (FOSAMAX) 70 MG tablet Take 1 tablet (70 mg total) by mouth every 7 (seven) days. Take with a full glass of water on an empty  stomach. Do not lay down for at least 2 hours   allopurinol (ZYLOPRIM) 100 MG tablet Take 1 tablet (100 mg total) by mouth daily.   Cholecalciferol (VITAMIN D) 2000 UNITS CAPS Take 1 capsule by mouth.   hydrocortisone (ANUSOL-HC) 2.5 % rectal cream Place 1 Application rectally 2 (two) times daily.   lisinopril (ZESTRIL) 20 MG tablet Take 1 tablet (20 mg total) by mouth daily.   Multiple Vitamin (MULTIVITAMIN) tablet Take 1 tablet by mouth daily.   NIFEdipine (PROCARDIA XL/NIFEDICAL XL) 60 MG 24 hr tablet Take 1 tablet (60 mg total) by mouth daily.   No facility-administered encounter medications on file as of 07/16/2023.    Allergies (verified) Patient has no known allergies.   History: Past Medical History:  Diagnosis Date   Gout    Hypertension    Osteoporosis    Past Surgical History:  Procedure Laterality Date   TONSILLECTOMY     Family History  Problem Relation Age of Onset   Cancer Mother    Deep vein thrombosis Father    Hypertension Sister    Hypertension Brother    Social History   Socioeconomic History   Marital status: Widowed    Spouse name: Not on file   Number of children: 0   Years of education: college   Highest education level: Associate degree: academic program  Occupational History   Occupation: Retired  Tobacco Use   Smoking status: Never   Smokeless tobacco: Never  Building services engineer  status: Never Used  Substance and Sexual Activity   Alcohol use: Never   Drug use: Never   Sexual activity: Not Currently  Other Topics Concern   Not on file  Social History Narrative   Sister recently had stroke - she is helping care for her    Social Drivers of Health   Financial Resource Strain: Low Risk  (07/16/2023)   Overall Financial Resource Strain (CARDIA)    Difficulty of Paying Living Expenses: Not hard at all  Food Insecurity: No Food Insecurity (07/16/2023)   Hunger Vital Sign    Worried About Running Out of Food in the Last Year: Never true     Ran Out of Food in the Last Year: Never true  Transportation Needs: No Transportation Needs (07/16/2023)   PRAPARE - Administrator, Civil Service (Medical): No    Lack of Transportation (Non-Medical): No  Physical Activity: Insufficiently Active (07/16/2023)   Exercise Vital Sign    Days of Exercise per Week: 3 days    Minutes of Exercise per Session: 30 min  Stress: No Stress Concern Present (07/16/2023)   [employer] of Occupational Health - Occupational Stress Questionnaire    Feeling of Stress : Not at all  Social Connections: Moderately Isolated (07/16/2023)   Social Connection and Isolation Panel [NHANES]    Frequency of Communication with Friends and Family: More than three times a week    Frequency of Social Gatherings with Friends and Family: Three times a week    Attends Religious Services: More than 4 times per year    Active Member of Clubs or Organizations: No    Attends Banker Meetings: Never    Marital Status: Widowed    Tobacco Counseling Counseling given: Not Answered    Clinical Intake:  Pre-visit preparation completed: Yes  Pain : No/denies pain     Diabetes: No  No results found for: "HGBA1C"   How often do you need to have someone help you when you read instructions, pamphlets, or other written materials from your doctor or pharmacy?: 1 - Never  Interpreter Needed?: No  Information entered by :: Kandis Fantasia LPN   Activities of Daily Living     07/16/2023    1:10 PM  In your present state of health, do you have any difficulty performing the following activities:  Hearing? 0  Vision? 0  Difficulty concentrating or making decisions? 0  Walking or climbing stairs? 0  Dressing or bathing? 0  Doing errands, shopping? 0  Preparing Food and eating ? N  Using the Toilet? N  In the past six months, have you accidently leaked urine? N  Do you have problems with loss of bowel control? N  Managing your Medications?  N  Managing your Finances? N  Housekeeping or managing your Housekeeping? N    Patient Care Team: Mechele Claude, MD as PCP - General (Family Medicine)  Indicate any recent Medical Services you may have received from other than [facility] providers in the past year (date may be approximate).     Assessment:   This is a routine wellness examination for Kaedence.  Hearing/Vision screen Hearing Screening - Comments:: Denies hearing difficulties   Vision Screening - Comments:: No vision problems; will schedule routine eye exam soon     Goals Addressed   None    Depression Screen     07/16/2023    1:09 PM 06/02/2023    8:50 AM 10/29/2022    7:54 AM  07/13/2022    9:10 AM 04/30/2022    8:22 AM 10/28/2021    8:54 AM 07/09/2021    9:05 AM  PHQ 2/9 Scores  PHQ - 2 Score 0 0 0 0 0 0 0  PHQ- 9 Score 0 0         Fall Risk     07/16/2023    1:09 PM 10/29/2022    7:54 AM 07/13/2022    9:09 AM 04/30/2022    8:22 AM 10/28/2021    8:54 AM  Fall Risk   Falls in the past year? 0 0 0 0 0  Number falls in past yr: 0  0    Injury with Fall? 0  0    Risk for fall due to : No Fall Risks  No Fall Risks    Follow up Falls prevention discussed;Education provided;Falls evaluation completed  Falls prevention discussed      MEDICARE RISK AT HOME:  Medicare Risk at Home Any stairs in or around the home?: No If so, are there any without handrails?: No Home free of loose throw rugs in walkways, pet beds, electrical cords, etc?: Yes Adequate lighting in your home to reduce risk of falls?: Yes Life alert?: No Use of a cane, walker or w/c?: No Grab bars in the bathroom?: Yes Shower chair or bench in shower?: No Elevated toilet seat or a handicapped toilet?: Yes  TIMED UP AND GO:  Was the test performed?  No  Cognitive Function: 6CIT completed    06/22/2018   10:19 AM  MMSE - Mini Mental State Exam  Orientation to time 5  Orientation to Place 5  Registration 3  Attention/ Calculation 5  Recall 3   Language- name 2 objects 2  Language- repeat 1  Language- follow 3 step command 3  Language- read & follow direction 1  Write a sentence 1  Copy design 1  Total score 30        07/16/2023    1:10 PM 07/13/2022    9:12 AM 07/08/2020    2:06 PM 06/23/2019   10:42 AM  6CIT Screen  What Year? 0 points 0 points 0 points 0 points  What month? 0 points 0 points 0 points 0 points  What time? 0 points 0 points 0 points 0 points  Count back from 20 0 points 0 points 2 points 0 points  Months in reverse 0 points 0 points 0 points 0 points  Repeat phrase 0 points 0 points 0 points 0 points  Total Score 0 points 0 points 2 points 0 points    Immunizations Immunization History  Administered Date(s) Administered   Fluad Quad(high Dose 65+) 02/09/2019, 01/16/2020, 01/23/2021, 01/15/2022   Fluad Trivalent(High Dose 65+) 02/03/2023   Influenza, High Dose Seasonal PF 02/09/2017, 01/28/2018   Influenza,inj,Quad PF,6+ Mos 01/31/2013, 01/29/2014, 01/30/2015, 01/20/2016   PFIZER Comirnaty(Gray Top)Covid-19 Tri-Sucrose Vaccine 07/27/2019, 08/18/2019, 02/20/2020   Pneumococcal Conjugate-13 11/05/2014   Pneumococcal Polysaccharide-23 07/29/2011   Tdap 05/07/2014   Zoster Recombinant(Shingrix) 10/30/2020, 05/06/2021   Zoster, Live 07/04/2013    Screening Tests Health Maintenance  Topic Date Due   COVID-19 Vaccine (4 - 2024-25 season) 12/20/2022   DTaP/Tdap/Td (2 - Td or Tdap) 05/07/2024   Medicare Annual Wellness (AWV)  07/15/2024   DEXA SCAN  10/28/2024   Pneumonia Vaccine 28+ Years old  Completed   INFLUENZA VACCINE  Completed   Zoster Vaccines- Shingrix  Completed   HPV VACCINES  Aged Out    Health  Maintenance  Health Maintenance Due  Topic Date Due   COVID-19 Vaccine (4 - 2024-25 season) 12/20/2022   Additional Screening:  Vision Screening: Recommended annual ophthalmology exams for early detection of glaucoma and other disorders of the eye.  Dental Screening: Recommended annual  dental exams for proper oral hygiene  Community Resource Referral / Chronic Care Management: CRR required this visit?  No   CCM required this visit?  No     Plan:     I have personally reviewed and noted the following in the patient's chart:   Medical and social history Use of alcohol, tobacco or illicit drugs  Current medications and supplements including opioid prescriptions. Patient is not currently taking opioid prescriptions. Functional ability and status Nutritional status Physical activity Advanced directives List of other physicians Hospitalizations, surgeries, and ER visits in previous 12 months Vitals Screenings to include cognitive, depression, and falls Referrals and appointments  In addition, I have reviewed and discussed with patient certain preventive protocols, quality metrics, and best practice recommendations. A written personalized care plan for preventive services as well as general preventive health recommendations were provided to patient.     Kandis Fantasia [place], [place]   06/03/863   After Visit Summary: (MyChart) Due to this being a telephonic visit, the after visit summary with patients personalized plan was offered to patient via MyChart   Notes: Nothing significant to report at this time.

## 2023-07-16 NOTE — Patient Instructions (Signed)
 Tamara Gross , Thank you for taking time to come for your Medicare Wellness Visit. I appreciate your ongoing commitment to your health goals. Please review the following plan we discussed and let me know if I can assist you in the future.   Referrals/Orders/Follow-Ups/Clinician Recommendations: Aim for 30 minutes of exercise or brisk walking, 6-8 glasses of water, and 5 servings of fruits and vegetables each day.  This is a list of the screening recommended for you and due dates:  Health Maintenance  Topic Date Due   COVID-19 Vaccine (4 - 2024-25 season) 12/20/2022   DTaP/Tdap/Td vaccine (2 - Td or Tdap) 05/07/2024   Medicare Annual Wellness Visit  07/15/2024   DEXA scan (bone density measurement)  10/28/2024   Pneumonia Vaccine  Completed   Flu Shot  Completed   Zoster (Shingles) Vaccine  Completed   HPV Vaccine  Aged Out    Advanced directives: (ACP Link)Information on Advanced Care Planning can be found at [facility] [place] Advance Health Care Directives Advance Health Care Directives. [email or web address]   Next Medicare Annual Wellness Visit scheduled for next year: Yes

## 2023-11-04 ENCOUNTER — Other Ambulatory Visit: Payer: Self-pay | Admitting: Family Medicine

## 2023-11-04 DIAGNOSIS — I10 Essential (primary) hypertension: Secondary | ICD-10-CM

## 2023-12-01 ENCOUNTER — Encounter: Payer: Self-pay | Admitting: Family Medicine

## 2023-12-01 ENCOUNTER — Ambulatory Visit (INDEPENDENT_AMBULATORY_CARE_PROVIDER_SITE_OTHER): Payer: BLUE CROSS/BLUE SHIELD | Admitting: Family Medicine

## 2023-12-01 VITALS — BP 132/78 | HR 96 | Temp 97.7°F | Ht 61.0 in | Wt 101.4 lb

## 2023-12-01 DIAGNOSIS — E782 Mixed hyperlipidemia: Secondary | ICD-10-CM

## 2023-12-01 DIAGNOSIS — M1000 Idiopathic gout, unspecified site: Secondary | ICD-10-CM | POA: Diagnosis not present

## 2023-12-01 DIAGNOSIS — M810 Age-related osteoporosis without current pathological fracture: Secondary | ICD-10-CM

## 2023-12-01 DIAGNOSIS — N1832 Chronic kidney disease, stage 3b: Secondary | ICD-10-CM

## 2023-12-01 DIAGNOSIS — I10 Essential (primary) hypertension: Secondary | ICD-10-CM | POA: Diagnosis not present

## 2023-12-01 MED ORDER — RISEDRONATE SODIUM 150 MG PO TABS: 150.0000 mg | ORAL_TABLET | ORAL | 3 refills | Status: DC

## 2023-12-01 NOTE — Progress Notes (Signed)
 Subjective:  Patient ID: Tamara Gross, female    DOB: [birth year]  Age: 83 y.o. MRN: [id number]  CC: Medical Management of Chronic Issues   HPI Tamara Gross presents for  follow-up of hypertension. Patient has no history of headache chest pain or shortness of breath or recent cough. Patient also denies symptoms of TIA such as focal numbness or weakness. Patient denies side effects from medication. States taking it regularly. Discussed the use of AI scribe software for clinical note transcription with the patient, who gave verbal consent to proceed.  History of Present Illness   An 83 year old female with osteoporosis presents with neck pain and concerns about medication side effects.  She experiences neck pain, described as stiffness located on the right side of her neck at the back. She attributes the pain to her sleeping position and possibly weather changes. She has tried using Tylenol for relief but feels she may not be taking enough.  She is currently taking alendronate  once a week for bone health but reports that it bothers her kidneys, causing her to drink a lot of water. She also takes 2000 units of vitamin D  daily and is mindful of her calcium intake, trying to get it from dietary sources like yogurt.  Her current medications include Procardia  (nifedipine ) and lisinopril  for blood pressure, and allopurinol  for gout prevention. She also takes a multivitamin.  She mentions experiencing stress due to caregiving responsibilities for her brother, who is now in a nursing home. No other significant health issues aside from the neck pain and medication concerns.       History Tamara Gross has a past medical history of Gout, Hypertension, and Osteoporosis.   She has a past surgical history that includes Tonsillectomy.   Her family history includes Cancer in her mother; Deep vein thrombosis in her father; Hypertension in her brother and sister.She reports that she has never smoked. She has  never used smokeless tobacco. She reports that she does not drink alcohol and does not use drugs.  Current Outpatient Medications on File Prior to Visit  Medication Sig Dispense Refill   allopurinol  (ZYLOPRIM ) 100 MG tablet Take 1 tablet (100 mg total) by mouth daily. 90 tablet 3   Cholecalciferol (VITAMIN D ) 2000 UNITS CAPS Take 1 capsule by mouth.     hydrocortisone  (ANUSOL -HC) 2.5 % rectal cream Place 1 Application rectally 2 (two) times daily. 30 g 1   lisinopril  (ZESTRIL ) 20 MG tablet Take 1 tablet by mouth once daily 90 tablet 0   Multiple Vitamin (MULTIVITAMIN) tablet Take 1 tablet by mouth daily.     NIFEdipine  (PROCARDIA  XL/NIFEDICAL XL) 60 MG 24 hr tablet Take 1 tablet (60 mg total) by mouth daily. 90 tablet 3   No current facility-administered medications on file prior to visit.    ROS Review of Systems  Constitutional: Negative.   HENT:  Negative for congestion.   Eyes:  Negative for visual disturbance.  Respiratory:  Negative for shortness of breath.   Cardiovascular:  Negative for chest pain.  Gastrointestinal:  Negative for abdominal pain, constipation, diarrhea, nausea and vomiting.  Genitourinary:  Negative for difficulty urinating.  Musculoskeletal:  Negative for arthralgias and myalgias.  Neurological:  Negative for headaches.  Psychiatric/Behavioral:  Negative for sleep disturbance.     Objective:  BP 132/78   Pulse 96   Temp 97.7 F (36.5 C)   Ht 5' 1 (1.549 m)   Wt 101 lb 6.4 oz (46 kg)   SpO2  97%   BMI 19.16 kg/m   BP Readings from Last 3 Encounters:  12/01/23 132/78  06/02/23 (!) 141/82  04/27/23 (!) 172/91    Wt Readings from Last 3 Encounters:  12/01/23 101 lb 6.4 oz (46 kg)  07/16/23 101 lb (45.8 kg)  04/27/23 101 lb (45.8 kg)     Physical Exam Constitutional:      General: She is not in acute distress.    Appearance: She is well-developed.  Cardiovascular:     Rate and Rhythm: Normal rate and regular rhythm.  Pulmonary:      Breath sounds: Normal breath sounds.  Musculoskeletal:        General: Normal range of motion.  Skin:    General: Skin is warm and dry.  Neurological:     Mental Status: She is alert and oriented to person, place, and time.       Assessment & Plan:  Primary hypertension -     CBC with Differential/Platelet -     CMP14+EGFR  Mixed hyperlipidemia -     Lipid panel  Age-related osteoporosis without current pathological fracture  Idiopathic gout, unspecified chronicity, unspecified site  Stage 3b chronic kidney disease (HCC)  Other orders -     Risedronate  Sodium; Take 1 tablet (150 mg total) by mouth every 30 (thirty) days. with water on empty stomach, nothing by mouth or lie down for next 30 minutes.  Dispense: 3 tablet; Refill: 3  Assessment and Plan    Neck pain, right posterior Right posterior neck pain likely due to muscle tightness and stress, prefers non-pharmacological management. - Use heat therapy such as a heating pad or hot shower. - Take two extra strength Tylenol (500 mg) three times a day. - Avoid muscle relaxants due to potential drowsiness.  Osteoporosis Osteoporosis management with alendronate  causing discomfort, discussed alternative treatment with risedronate . Emphasized calcium and vitamin D  supplementation. - Switch to risedronate  if alendronate  is not tolerated. - Continue calcium and vitamin D  supplementation. - Ensure dietary intake of calcium with low-fat options.  Chronic kidney disease, unspecified stage Chronic kidney disease is a concern, cautious about medication intake due to kidney health. - Monitor kidney function with regular blood work.  Essential hypertension Hypertension managed with nifedipine  and lisinopril , no issues reported.  Gout prophylaxis Gout prophylaxis managed with allopurinol , no issues reported.        Allergies as of 12/01/2023   No Known Allergies      Medication List        Accurate as of Tamara 13,  2025  9:01 AM. If you have any questions, ask your nurse or doctor.          STOP taking these medications    alendronate  70 MG tablet Commonly known as: FOSAMAX  Stopped by: Missi Mcmackin       TAKE these medications    allopurinol  100 MG tablet Commonly known as: ZYLOPRIM  Take 1 tablet (100 mg total) by mouth daily.   hydrocortisone  2.5 % rectal cream Commonly known as: ANUSOL -HC Place 1 Application rectally 2 (two) times daily.   lisinopril  20 MG tablet Commonly known as: ZESTRIL  Take 1 tablet by mouth once daily   multivitamin tablet Take 1 tablet by mouth daily.   NIFEdipine  60 MG 24 hr tablet Commonly known as: PROCARDIA  XL/NIFEDICAL XL Take 1 tablet (60 mg total) by mouth daily.   risedronate  150 MG tablet Commonly known as: ACTONEL  Take 1 tablet (150 mg total) by mouth every 30 (thirty) days. with  water on empty stomach, nothing by mouth or lie down for next 30 minutes. Started by: Butler Kiyoto Slomski   Vitamin D  50 MCG (2000 UT) Caps Take 1 capsule by mouth.         Follow-up: Return in about 6 months (around 06/02/2024).  Butler Der, M.D.

## 2023-12-02 LAB — CMP14+EGFR
ALT: 8 IU/L (ref 0–32)
AST: 14 IU/L (ref 0–40)
Albumin: 4.4 g/dL (ref 3.7–4.7)
Alkaline Phosphatase: 60 IU/L (ref 44–121)
BUN/Creatinine Ratio: 15 (ref 12–28)
BUN: 22 mg/dL (ref 8–27)
Bilirubin Total: 0.4 mg/dL (ref 0.0–1.2)
CO2: 18 mmol/L — ABNORMAL LOW (ref 20–29)
Calcium: 9.8 mg/dL (ref 8.7–10.3)
Chloride: 96 mmol/L (ref 96–106)
Creatinine, Ser: 1.48 mg/dL — ABNORMAL HIGH (ref 0.57–1.00)
Globulin, Total: 2.5 g/dL (ref 1.5–4.5)
Glucose: 91 mg/dL (ref 70–99)
Potassium: 4.7 mmol/L (ref 3.5–5.2)
Sodium: 131 mmol/L — ABNORMAL LOW (ref 134–144)
Total Protein: 6.9 g/dL (ref 6.0–8.5)
eGFR: 35 mL/min/1.73 — ABNORMAL LOW (ref >59)

## 2023-12-02 LAB — CBC WITH DIFFERENTIAL/PLATELET
Basophils Absolute: 0.1 x10E3/uL (ref 0.0–0.2)
Basos: 1 %
EOS (ABSOLUTE): 0.3 x10E3/uL (ref 0.0–0.4)
Eos: 4 %
Hematocrit: 34.8 % (ref 34.0–46.6)
Hemoglobin: 11 g/dL — ABNORMAL LOW (ref 11.1–15.9)
Immature Grans (Abs): 0 x10E3/uL (ref 0.0–0.1)
Immature Granulocytes: 0 %
Lymphocytes Absolute: 1.1 x10E3/uL (ref 0.7–3.1)
Lymphs: 18 %
MCH: 32 pg (ref 26.6–33.0)
MCHC: 31.6 g/dL (ref 31.5–35.7)
MCV: 101 fL — ABNORMAL HIGH (ref 79–97)
Monocytes Absolute: 0.6 x10E3/uL (ref 0.1–0.9)
Monocytes: 10 %
Neutrophils Absolute: 4.2 x10E3/uL (ref 1.4–7.0)
Neutrophils: 67 %
Platelets: 361 x10E3/uL (ref 150–450)
RBC: 3.44 x10E6/uL — ABNORMAL LOW (ref 3.77–5.28)
RDW: 13.3 % (ref 11.7–15.4)
WBC: 6.3 x10E3/uL (ref 3.4–10.8)

## 2023-12-02 LAB — LIPID PANEL
Chol/HDL Ratio: 3 ratio (ref 0.0–4.4)
Cholesterol, Total: 186 mg/dL (ref 100–199)
HDL: 62 mg/dL (ref >39)
LDL Chol Calc (NIH): 107 mg/dL — ABNORMAL HIGH (ref 0–99)
Triglycerides: 92 mg/dL (ref 0–149)
VLDL Cholesterol Cal: 17 mg/dL (ref 5–40)

## 2023-12-05 ENCOUNTER — Ambulatory Visit: Payer: Self-pay | Admitting: Family Medicine

## 2023-12-05 NOTE — Progress Notes (Signed)
Hello Suriyah,  Your lab result is normal and/or stable.Some minor variations that are not significant are commonly marked abnormal, but do not represent any medical problem for you.  Best regards, Michiel Sivley, M.D.

## 2023-12-10 ENCOUNTER — Other Ambulatory Visit (HOSPITAL_COMMUNITY): Payer: Self-pay

## 2023-12-10 ENCOUNTER — Telehealth: Payer: Self-pay

## 2023-12-10 NOTE — Telephone Encounter (Signed)
 Pharmacy Patient Advocate Encounter   Received notification from Onbase that prior authorization for Risedronate  150 mg tablet is required/requested.   Insurance verification completed.   The patient is insured through [employer] .   Per test claim: PA required; PA submitted to above mentioned insurance via Latent Key/confirmation #/EOC [id number] Status is pending

## 2023-12-13 ENCOUNTER — Other Ambulatory Visit: Payer: Self-pay | Admitting: Family Medicine

## 2023-12-13 MED ORDER — IBANDRONATE SODIUM 150 MG PO TABS: 150.0000 mg | ORAL_TABLET | ORAL | 3 refills | Status: AC

## 2023-12-13 NOTE — Telephone Encounter (Signed)
 Please let the patient know the insurance approved a very similar medication that I also recommend for osteoporsis. It is ibandronate . Take it once a month.

## 2023-12-13 NOTE — Telephone Encounter (Signed)
 Pharmacy Patient Advocate Encounter  Received notification from [facility] that Prior Authorization for Risedronate  Sodium 150MG  tablets  has been DENIED.  See denial reason below. No denial letter attached in CMM. Will attach denial letter to Media tab once received.   PA #/Case ID/Reference #: [id number]

## 2023-12-14 NOTE — Telephone Encounter (Signed)
 PATIENT AWARE

## 2023-12-27 ENCOUNTER — Other Ambulatory Visit: Payer: Self-pay | Admitting: Family Medicine

## 2023-12-27 DIAGNOSIS — I10 Essential (primary) hypertension: Secondary | ICD-10-CM

## 2024-01-31 ENCOUNTER — Other Ambulatory Visit: Payer: Self-pay | Admitting: Family Medicine

## 2024-01-31 DIAGNOSIS — I10 Essential (primary) hypertension: Secondary | ICD-10-CM

## 2024-02-18 ENCOUNTER — Ambulatory Visit (INDEPENDENT_AMBULATORY_CARE_PROVIDER_SITE_OTHER)

## 2024-02-18 DIAGNOSIS — Z23 Encounter for immunization: Secondary | ICD-10-CM

## 2024-06-05 ENCOUNTER — Ambulatory Visit: Payer: Self-pay | Admitting: Family Medicine

## 2024-07-18 ENCOUNTER — Ambulatory Visit: Payer: Self-pay
